# Patient Record
Sex: Female | Born: 1955 | Race: Black or African American | Hispanic: No | Smoking: Current some day smoker
Health system: Southern US, Community
[De-identification: ages and names within clinical notes are randomized; demographics above are authoritative.]

## PROBLEM LIST (undated history)

## (undated) DIAGNOSIS — M069 Rheumatoid arthritis, unspecified: Secondary | ICD-10-CM

## (undated) DIAGNOSIS — I10 Essential (primary) hypertension: Secondary | ICD-10-CM

## (undated) HISTORY — PX: TONSILLECTOMY: SUR1361

## (undated) HISTORY — PX: HAND SURGERY: SHX662

---

## 1998-01-16 ENCOUNTER — Other Ambulatory Visit: Admission: RE | Admit: 1998-01-16 | Discharge: 1998-01-16 | Payer: Self-pay | Admitting: Gynecology

## 1999-08-29 ENCOUNTER — Inpatient Hospital Stay (HOSPITAL_COMMUNITY): Admission: EM | Admit: 1999-08-29 | Discharge: 1999-09-01 | Payer: Self-pay | Admitting: *Deleted

## 1999-09-26 ENCOUNTER — Emergency Department (HOSPITAL_COMMUNITY): Admission: EM | Admit: 1999-09-26 | Discharge: 1999-09-26 | Payer: Self-pay | Admitting: Emergency Medicine

## 1999-11-04 ENCOUNTER — Encounter: Payer: Self-pay | Admitting: Internal Medicine

## 1999-11-04 ENCOUNTER — Emergency Department (HOSPITAL_COMMUNITY): Admission: EM | Admit: 1999-11-04 | Discharge: 1999-11-04 | Payer: Self-pay | Admitting: Internal Medicine

## 1999-11-05 ENCOUNTER — Encounter: Payer: Self-pay | Admitting: Cardiovascular Disease

## 1999-11-05 ENCOUNTER — Ambulatory Visit (HOSPITAL_COMMUNITY): Admission: RE | Admit: 1999-11-05 | Discharge: 1999-11-05 | Payer: Self-pay | Admitting: Cardiovascular Disease

## 2000-03-23 ENCOUNTER — Other Ambulatory Visit: Admission: RE | Admit: 2000-03-23 | Discharge: 2000-03-23 | Payer: Self-pay | Admitting: Gynecology

## 2000-04-01 ENCOUNTER — Encounter: Payer: Self-pay | Admitting: Gynecology

## 2000-04-01 ENCOUNTER — Encounter: Admission: RE | Admit: 2000-04-01 | Discharge: 2000-04-01 | Payer: Self-pay | Admitting: Gynecology

## 2000-07-02 ENCOUNTER — Emergency Department (HOSPITAL_COMMUNITY): Admission: EM | Admit: 2000-07-02 | Discharge: 2000-07-02 | Payer: Self-pay | Admitting: Emergency Medicine

## 2000-07-02 ENCOUNTER — Encounter: Payer: Self-pay | Admitting: Emergency Medicine

## 2001-01-25 ENCOUNTER — Encounter: Payer: Self-pay | Admitting: Emergency Medicine

## 2001-01-25 ENCOUNTER — Emergency Department (HOSPITAL_COMMUNITY): Admission: EM | Admit: 2001-01-25 | Discharge: 2001-01-25 | Payer: Self-pay | Admitting: Emergency Medicine

## 2001-09-23 ENCOUNTER — Other Ambulatory Visit: Admission: RE | Admit: 2001-09-23 | Discharge: 2001-09-23 | Payer: Self-pay | Admitting: Gynecology

## 2002-09-20 ENCOUNTER — Emergency Department (HOSPITAL_COMMUNITY): Admission: EM | Admit: 2002-09-20 | Discharge: 2002-09-20 | Payer: Self-pay | Admitting: Emergency Medicine

## 2002-11-20 ENCOUNTER — Encounter: Payer: Self-pay | Admitting: Emergency Medicine

## 2002-11-20 ENCOUNTER — Emergency Department (HOSPITAL_COMMUNITY): Admission: EM | Admit: 2002-11-20 | Discharge: 2002-11-20 | Payer: Self-pay | Admitting: Emergency Medicine

## 2002-11-23 ENCOUNTER — Encounter: Payer: Self-pay | Admitting: Internal Medicine

## 2002-11-23 ENCOUNTER — Encounter: Admission: RE | Admit: 2002-11-23 | Discharge: 2002-11-23 | Payer: Self-pay | Admitting: Internal Medicine

## 2002-12-21 ENCOUNTER — Emergency Department (HOSPITAL_COMMUNITY): Admission: EM | Admit: 2002-12-21 | Discharge: 2002-12-21 | Payer: Self-pay | Admitting: Emergency Medicine

## 2002-12-27 ENCOUNTER — Other Ambulatory Visit: Admission: RE | Admit: 2002-12-27 | Discharge: 2002-12-27 | Payer: Self-pay | Admitting: Gynecology

## 2004-02-21 ENCOUNTER — Encounter: Admission: RE | Admit: 2004-02-21 | Discharge: 2004-02-21 | Payer: Self-pay | Admitting: Internal Medicine

## 2005-04-03 ENCOUNTER — Emergency Department (HOSPITAL_COMMUNITY): Admission: EM | Admit: 2005-04-03 | Discharge: 2005-04-03 | Payer: Self-pay | Admitting: Family Medicine

## 2005-07-20 ENCOUNTER — Emergency Department (HOSPITAL_COMMUNITY): Admission: EM | Admit: 2005-07-20 | Discharge: 2005-07-20 | Payer: Self-pay | Admitting: Emergency Medicine

## 2006-01-13 ENCOUNTER — Encounter: Admission: RE | Admit: 2006-01-13 | Discharge: 2006-01-13 | Payer: Self-pay | Admitting: Internal Medicine

## 2007-03-09 ENCOUNTER — Encounter: Admission: RE | Admit: 2007-03-09 | Discharge: 2007-03-09 | Payer: Self-pay | Admitting: Internal Medicine

## 2008-03-19 ENCOUNTER — Encounter: Admission: RE | Admit: 2008-03-19 | Discharge: 2008-03-19 | Payer: Self-pay | Admitting: Internal Medicine

## 2008-06-27 ENCOUNTER — Emergency Department (HOSPITAL_COMMUNITY): Admission: EM | Admit: 2008-06-27 | Discharge: 2008-06-27 | Payer: Self-pay | Admitting: Emergency Medicine

## 2011-08-31 ENCOUNTER — Other Ambulatory Visit: Payer: Self-pay | Admitting: Internal Medicine

## 2011-08-31 DIAGNOSIS — Z1231 Encounter for screening mammogram for malignant neoplasm of breast: Secondary | ICD-10-CM

## 2011-09-02 ENCOUNTER — Ambulatory Visit
Admission: RE | Admit: 2011-09-02 | Discharge: 2011-09-02 | Disposition: A | Discharge status: Home / Self Care | Payer: BC Managed Care – PPO | Source: Ambulatory Visit | Attending: Internal Medicine | Admitting: Internal Medicine

## 2011-09-02 DIAGNOSIS — Z1231 Encounter for screening mammogram for malignant neoplasm of breast: Secondary | ICD-10-CM

## 2013-01-27 ENCOUNTER — Other Ambulatory Visit: Payer: Self-pay | Admitting: Internal Medicine

## 2013-01-27 DIAGNOSIS — IMO0002 Reserved for concepts with insufficient information to code with codable children: Secondary | ICD-10-CM

## 2013-02-04 ENCOUNTER — Ambulatory Visit
Admission: RE | Admit: 2013-02-04 | Discharge: 2013-02-04 | Disposition: A | Discharge status: Home / Self Care | Payer: BC Managed Care – PPO | Source: Ambulatory Visit | Attending: Internal Medicine | Admitting: Internal Medicine

## 2013-02-04 DIAGNOSIS — IMO0002 Reserved for concepts with insufficient information to code with codable children: Secondary | ICD-10-CM

## 2013-02-24 ENCOUNTER — Ambulatory Visit (HOSPITAL_BASED_OUTPATIENT_CLINIC_OR_DEPARTMENT_OTHER): Payer: BC Managed Care – PPO

## 2014-01-21 ENCOUNTER — Emergency Department (HOSPITAL_COMMUNITY)
Admission: EM | Admit: 2014-01-21 | Discharge: 2014-01-21 | Disposition: A | Discharge status: Home / Self Care | Payer: BC Managed Care – PPO | Attending: Emergency Medicine | Admitting: Emergency Medicine

## 2014-01-21 ENCOUNTER — Encounter (HOSPITAL_COMMUNITY): Payer: Self-pay | Admitting: Emergency Medicine

## 2014-01-21 DIAGNOSIS — I10 Essential (primary) hypertension: Secondary | ICD-10-CM | POA: Diagnosis not present

## 2014-01-21 DIAGNOSIS — Z791 Long term (current) use of non-steroidal anti-inflammatories (NSAID): Secondary | ICD-10-CM | POA: Insufficient documentation

## 2014-01-21 DIAGNOSIS — H209 Unspecified iridocyclitis: Secondary | ICD-10-CM | POA: Insufficient documentation

## 2014-01-21 DIAGNOSIS — J3489 Other specified disorders of nose and nasal sinuses: Secondary | ICD-10-CM | POA: Diagnosis not present

## 2014-01-21 DIAGNOSIS — Z79899 Other long term (current) drug therapy: Secondary | ICD-10-CM | POA: Insufficient documentation

## 2014-01-21 DIAGNOSIS — H5789 Other specified disorders of eye and adnexa: Secondary | ICD-10-CM | POA: Insufficient documentation

## 2014-01-21 DIAGNOSIS — M069 Rheumatoid arthritis, unspecified: Secondary | ICD-10-CM | POA: Diagnosis not present

## 2014-01-21 DIAGNOSIS — F17200 Nicotine dependence, unspecified, uncomplicated: Secondary | ICD-10-CM | POA: Diagnosis not present

## 2014-01-21 HISTORY — DX: Rheumatoid arthritis, unspecified: M06.9

## 2014-01-21 HISTORY — DX: Essential (primary) hypertension: I10

## 2014-01-21 MED ORDER — KETOROLAC TROMETHAMINE 0.5 % OP SOLN
1.0000 [drp] | Freq: Four times a day (QID) | OPHTHALMIC | Status: DC
  Administered 2014-01-21: 1 [drp] via OPHTHALMIC
  Filled 2014-01-21 (×2): qty 3

## 2014-01-21 MED ORDER — CYCLOPENTOLATE HCL 1 % OP SOLN
1.0000 [drp] | Freq: Once | OPHTHALMIC | Status: DC
  Filled 2014-01-21: qty 2

## 2014-01-21 MED ORDER — PREDNISOLONE ACETATE 1 % OP SUSP
1.0000 [drp] | Freq: Four times a day (QID) | OPHTHALMIC | Status: DC
  Administered 2014-01-21: 1 [drp] via OPHTHALMIC
  Filled 2014-01-21 (×2): qty 1

## 2014-01-21 MED ORDER — FLUORESCEIN SODIUM 1 MG OP STRP
1.0000 | ORAL_STRIP | Freq: Once | OPHTHALMIC | Status: AC
Start: 2014-01-21 — End: 2014-01-21
  Administered 2014-01-21: 1 via OPHTHALMIC
  Filled 2014-01-21: qty 1

## 2014-01-21 MED ORDER — TETRACAINE HCL 0.5 % OP SOLN
2.0000 [drp] | Freq: Once | OPHTHALMIC | Status: AC
  Administered 2014-01-21: 2 [drp] via OPHTHALMIC
  Filled 2014-01-21: qty 2

## 2014-01-21 NOTE — Discharge Instructions (Signed)
1. Medications: pred-forte, ketoralac, usual home medications 2. Treatment: rest, drink plenty of fluids, use medications before bed tonight and again in the morning before seeing Dr. Delaney Meigs 3. Follow Up: Please see Dr. Delaney Meigs at his office at the address listed above at 9am.     Iritis Iritis is an inflammation of the colored part of the eye (iris). Other parts at the front of the eye may also be inflamed. The iris is part of the middle layer of the eyeball which is called the uvea or the uveal track. Any part of the uveal track can become inflamed. The other portions of the uveal track are the choroid (the thin membrane under the outer layer of the eye), and the ciliary body (joins the choroid and the iris and produces the fluid in the front of the eye).  It is extremely important to treat iritis early, as it may lead to internal eye damage causing scarring or diseases such as glaucoma. Some people have only one attack of iritis (in one or both eyes) in their lifetime, while others may get it many times. CAUSES Iritis can be associated with many different diseases, but mostly occurs in otherwise healthy people. Examples of diseases that can be associated with iritis include:  Diseases where the body's immune system attacks tissues within your own body (autoimmune diseases).  Infections (tuberculosis, gonorrhea, fungus infections, Lyme disease, infection of the lining of the heart).  Trauma or injury.  Eye diseases (acute glaucoma and others).  Inflammation from other parts of the uveal track.  Severe eye infections.  Other rare diseases. SYMPTOMS  Eye pain or aching.  Sensitivity to light.  Loss of sight or blurred vision.  Redness of the eye. This is often accompanied by a ring of redness around the outside of the cornea, or clear covering at the front of the eye (ciliary flush).  Excessive tearing of the eye(s).  A small pupil that does not enlarge in the dark and  stays smaller than the other eye's pupil.  A whitish area that obscures the lower part of the colored circular iris. Sometimes this is visible when looking at the eye, where the whitish area has a "fluid level" or flat top. This is called a "hypopyon" and is actually pus inside the eye. Since iritis causes the eye to become red, it is often confused with a much less dangerous form of "pink eye" or conjunctivitis. One of the most important symptoms is sensitivity to light. Anytime there is redness, discomfort in the eye(s) and extreme light sensitivity, it is extremely important to see an ophthalmologist as soon as possible. TREATMENT Acute iritis requires prompt medical evaluation by an eye specialist (ophthalmologist.) Treatment depends on the underlying cause but may include:  Corticosteroid eye drops and dilating eye drops. Follow your caregiver's exact instructions on taking and stopping corticosteroid medications (drops or pills).  Occasionally, the iritis will be so severe that it will not respond to commonly used medications. If this happens, it may be necessary to use steroid injections. The injections are given under the eye's outer surface. Sometimes oral medications are given. The decision on treatment used for iritis is usually made on an individual basis. HOME CARE INSTRUCTIONS Your care giver will give specific instructions regarding the use of eye medications or other medications. Be certain to follow all instructions in both taking and stopping the medications. SEEK IMMEDIATE MEDICAL CARE IF:  You have redness of one or both eye.  You experience a  great deal of light sensitivity.  You have pain or aching in either eye. MAKE SURE YOU:   Understand these instructions.  Will watch your condition.  Will get help right away if you are not doing well or get worse. Document Released: 05/18/2005 Document Revised: 08/10/2011 Document Reviewed: 11/05/2006 [facility] Patient  Information 2015 [place], [place]. This information is not intended to replace advice given to you by your health care provider. Make sure you discuss any questions you have with your health care provider.

## 2014-01-21 NOTE — ED Notes (Signed)
Pt presents with left eye pain and redness since Wednesday, admits to drainage from eye.  Reports some blurred vision.

## 2014-01-21 NOTE — ED Provider Notes (Signed)
CSN: [id number]     Arrival date & time 01/21/14  2045 History  This chart was scribed for non-physician Dierdre Forth, PA-C, practitioner working with Elwin Mocha, MD, by Yevette Edwards, ED Scribe. This patient was seen in room TR04C/TR04C and the patient's care was started at 9:31 PM.   First MD Initiated Contact with Patient 01/21/14 2110     Chief Complaint  Patient presents with  . Eye Drainage    HPI HPI Comments: Sarah Sloan is a 58 y.o. female, with a h/o HTN, who presents to the Emergency Department complaining of gradually-increasing left eye pain which worsened this evening. Sarah Sloan reports five days ago she initially experienced itching to the eye which persisted for several days,. Then,  yesterday, she developed redness to the eye, and she experienced increasing ocular pain. She also endorses tearing from the eye, blurred vision, and photophobia. Sarah Sloan reports she experiences pain to the left eye when the right eye is exposed to the light as well. She denies a sensation of a foreign body. She wears corrective lens, but she states her prescription has not been updated recently. She denies contact usage. She reports chronic sinus infections which she treats with nasal spray; she denies current Flonase use. She takes HTN medication as directed; in the ED her BP is 162/83. Sarah Sloan is a current smoker.   Past Medical History  Diagnosis Date  . Hypertension   . Rheumatoid arthritis    History reviewed. No pertinent past surgical history. No family history on file. History  Substance Use Topics  . Smoking status: Current Some Day Smoker  . Smokeless tobacco: Not on file  . Alcohol Use: Yes   No OB history provided.  Review of Systems  Constitutional: Negative for fever, diaphoresis, appetite change, fatigue and unexpected weight change.  HENT: Negative for mouth sores.   Eyes: Positive for photophobia, pain, redness, itching and visual disturbance.   Respiratory: Negative for cough, chest tightness, shortness of breath and wheezing.   Cardiovascular: Negative for chest pain.  Gastrointestinal: Negative for nausea, vomiting, abdominal pain, diarrhea and constipation.  Endocrine: Negative for polydipsia, polyphagia and polyuria.  Genitourinary: Negative for dysuria, urgency, frequency and hematuria.  Musculoskeletal: Negative for back pain and neck stiffness.  Skin: Negative for rash.  Allergic/Immunologic: Negative for immunocompromised state.  Neurological: Negative for syncope, light-headedness and headaches.  Hematological: Does not bruise/bleed easily.  Psychiatric/Behavioral: Negative for sleep disturbance. The patient is not nervous/anxious.     Allergies  Penicillins  Home Medications   Prior to Admission medications   Medication Sig Start Date End Date Taking? Authorizing Provider  budesonide-formoterol (SYMBICORT) 160-4.5 MCG/ACT inhaler Inhale 2 puffs into the lungs 2 (two) times daily.   Yes Historical Provider, MD  Cholecalciferol (VITAMIN D PO) Take 1 tablet by mouth daily.   Yes Historical Provider, MD  diclofenac (VOLTAREN) 75 MG EC tablet Take 75 mg by mouth 2 (two) times daily.  11/28/13  Yes Historical Provider, MD  GNP GARLIC EXTRACT PO Take 5 drops by mouth daily as needed (chest tightness).   Yes Historical Provider, MD  losartan-hydrochlorothiazide (HYZAAR) 50-12.5 MG per tablet Take 1 tablet by mouth daily.  01/02/14  Yes Historical Provider, MD  Menthol, Topical Analgesic, (BIOFREEZE EX) Apply 1 application topically 2 (two) times daily.   Yes Historical Provider, MD  OVER THE COUNTER MEDICATION Take 240 mLs by mouth daily as needed (chest tightness). White oak bark - brew and drink 8 oz  Yes Historical Provider, MD  PRESCRIPTION MEDICATION Inhale 2 puffs into the lungs daily as needed (shortness of breath/wheezing). Rescue inhaler   Yes Historical Provider, MD  sodium chloride (OCEAN) 0.65 % SOLN nasal spray  Place 2 sprays into both nostrils daily.   Yes Historical Provider, MD   Triage Vitals: BP 162/83  Pulse 71  Temp(Src) 98.6 F (37 C) (Oral)  Resp 18  Ht 5\' 8"  (1.727 m)  Wt 240 lb (108.863 kg)  BMI 36.50 kg/m2  SpO2 97%  Physical Exam  Constitutional: She is oriented to person, place, and time. She appears well-developed and well-nourished. No distress.  HENT:  Head: Normocephalic and atraumatic.  Right Ear: Tympanic membrane, external ear and ear canal normal.  Left Ear: Tympanic membrane, external ear and ear canal normal.  Nose: Mucosal edema and rhinorrhea present. No epistaxis. Right sinus exhibits no maxillary sinus tenderness and no frontal sinus tenderness. Left sinus exhibits no maxillary sinus tenderness and no frontal sinus tenderness.  Mouth/Throat: Uvula is midline and mucous membranes are normal. Mucous membranes are not pale and not cyanotic. No oropharyngeal exudate, posterior oropharyngeal edema, posterior oropharyngeal erythema or tonsillar abscesses.  Eyes: EOM and lids are normal. Pupils are equal, round, and reactive to light. Lids are everted and swept, no foreign bodies found. Right eye exhibits no chemosis, no discharge and no exudate. No foreign body present in the right eye. Left eye exhibits no chemosis, no discharge and no exudate. No foreign body present in the left eye. Right conjunctiva is not injected. Right conjunctiva has no hemorrhage. Left conjunctiva is injected. Left conjunctiva has no hemorrhage. Right eye exhibits normal extraocular motion. Left eye exhibits normal extraocular motion.  Fundoscopic exam:      The right eye shows no arteriolar narrowing, no AV nicking, no exudate and no hemorrhage.       The left eye shows no arteriolar narrowing, no AV nicking, no exudate and no hemorrhage.  Slit lamp exam:      The right eye shows no corneal abrasion, no corneal flare, no corneal ulcer, no foreign body, no hyphema, no fluorescein uptake and no  anterior chamber bulge.       The left eye shows no corneal abrasion, no corneal flare, no corneal ulcer, no foreign body, no hyphema, no fluorescein uptake and no anterior chamber bulge.  No corneal abrasions visualized with fluorescein.  Left eye: injection of the conjunctiva with full extraocular motions. Direct and consensual photophobia with myosis.  Tonopen: 11 Left eye No absrasions, ulcers, foreign bodies, or dendritic lesions.  Visual Acuity - Bilateral Distance: 20/40 ; R Distance: 20/40 ; L Distance: 20/50  Neck: Normal range of motion and full passive range of motion without pain.  Cardiovascular: Normal rate and intact distal pulses.   Pulmonary/Chest: Effort normal and breath sounds normal. No stridor.  Clear and equal breath sounds without focal wheezes, rhonchi, rales  Abdominal: Soft. Bowel sounds are normal. There is no tenderness.  Musculoskeletal: Normal range of motion.  Lymphadenopathy:    She has no cervical adenopathy.  Neurological: She is alert and oriented to person, place, and time.  Skin: Skin is warm and dry. No rash noted. She is not diaphoretic.  Psychiatric: She has a normal mood and affect.    ED Course  Procedures (including critical care time)  DIAGNOSTIC STUDIES: Oxygen Saturation is 97% on room air, normal by my interpretation.    COORDINATION OF CARE:  9:37 PM- Discussed treatment plan with patient,  and the patient agreed to the plan. Provided pt with opthamologist referral. Will also provided medication.   9:40 PM- Measured pt's tonometry. Left eye pressure with tonopen: 11. Performed fluorescein procedure.   9:48 PM- Consulted with Dr. Gwendolyn Grant re the pt's course of care.   Labs Review Labs Reviewed - No data to display  Imaging Review No results found.   EKG Interpretation None      MDM   Final diagnoses:  Iritis    Sarah Sloan presents with history and physical consistent with iritis. Patient with history of hypertension  however no evidence of elevated intraocular pressure to suggest acute angle closure glaucoma.  No abrasions to suggest corneal abrasion. No dendritic lesions to suggest herpes.    10:06 PM Discussed with Dr. Delaney Meigs who recommends Cyclodryl, pred-forte and ketoralac.  He will see her in his office tomorrow morning at 9 AM.  Discussed all these things with the patient. She reports she will followup. She knows to return to emergency room for worsening symptoms.  BP 162/83  Pulse 71  Temp(Src) 98.6 F (37 C) (Oral)  Resp 18  Ht 5\' 8"  (1.727 m)  Wt 240 lb (108.863 kg)  BMI 36.50 kg/m2  SpO2 97%   I personally performed the services described in this documentation, which was scribed in my presence. The recorded information has been reviewed and is accurate.   Anothy Bufano, PA-C 01/22/14 [phone]

## 2014-01-25 NOTE — ED Provider Notes (Signed)
Medical screening examination/treatment/procedure(s) were performed by non-physician practitioner and as supervising physician I was immediately available for consultation/collaboration.   EKG Interpretation None        Elwin Mocha, MD 01/25/14 [phone]

## 2016-06-17 ENCOUNTER — Emergency Department (HOSPITAL_COMMUNITY): Payer: Worker's Compensation

## 2016-06-17 ENCOUNTER — Encounter (HOSPITAL_COMMUNITY): Payer: Self-pay

## 2016-06-17 ENCOUNTER — Emergency Department (HOSPITAL_COMMUNITY)
Admission: EM | Admit: 2016-06-17 | Discharge: 2016-06-17 | Disposition: A | Discharge status: Home / Self Care | Payer: Worker's Compensation | Attending: Emergency Medicine | Admitting: Emergency Medicine

## 2016-06-17 DIAGNOSIS — Y929 Unspecified place or not applicable: Secondary | ICD-10-CM | POA: Diagnosis not present

## 2016-06-17 DIAGNOSIS — W000XXA Fall on same level due to ice and snow, initial encounter: Secondary | ICD-10-CM | POA: Diagnosis not present

## 2016-06-17 DIAGNOSIS — Y9329 Activity, other involving ice and snow: Secondary | ICD-10-CM | POA: Diagnosis not present

## 2016-06-17 DIAGNOSIS — W19XXXA Unspecified fall, initial encounter: Secondary | ICD-10-CM

## 2016-06-17 DIAGNOSIS — F17200 Nicotine dependence, unspecified, uncomplicated: Secondary | ICD-10-CM | POA: Insufficient documentation

## 2016-06-17 DIAGNOSIS — S52502A Unspecified fracture of the lower end of left radius, initial encounter for closed fracture: Secondary | ICD-10-CM | POA: Diagnosis not present

## 2016-06-17 DIAGNOSIS — S6992XA Unspecified injury of left wrist, hand and finger(s), initial encounter: Secondary | ICD-10-CM | POA: Diagnosis present

## 2016-06-17 DIAGNOSIS — Y999 Unspecified external cause status: Secondary | ICD-10-CM | POA: Diagnosis not present

## 2016-06-17 DIAGNOSIS — I10 Essential (primary) hypertension: Secondary | ICD-10-CM | POA: Insufficient documentation

## 2016-06-17 MED ORDER — LIDOCAINE HCL (PF) 1 % IJ SOLN
30.0000 mL | Freq: Once | INTRAMUSCULAR | Status: AC
  Administered 2016-06-17: 30 mL
  Filled 2016-06-17: qty 30

## 2016-06-17 MED ORDER — ONDANSETRON 4 MG PO TBDP
4.0000 mg | ORAL_TABLET | Freq: Once | ORAL | Status: AC
  Administered 2016-06-17: 4 mg via ORAL
  Filled 2016-06-17: qty 1

## 2016-06-17 MED ORDER — HYDROCODONE-ACETAMINOPHEN 5-325 MG PO TABS: 1.0000 | ORAL_TABLET | ORAL | 0 refills | Status: DC | PRN

## 2016-06-17 MED ORDER — ETOMIDATE 2 MG/ML IV SOLN
0.1500 mg/kg | Freq: Once | INTRAVENOUS | Status: DC
  Filled 2016-06-17: qty 10

## 2016-06-17 MED ORDER — MORPHINE SULFATE (PF) 4 MG/ML IV SOLN
4.0000 mg | Freq: Once | INTRAVENOUS | Status: AC
  Administered 2016-06-17: 4 mg via INTRAMUSCULAR
  Filled 2016-06-17: qty 1

## 2016-06-17 MED ORDER — IBUPROFEN 600 MG PO TABS: 600.0000 mg | ORAL_TABLET | Freq: Four times a day (QID) | ORAL | 0 refills | Status: AC | PRN

## 2016-06-17 NOTE — ED Provider Notes (Signed)
[facility]-EMERGENCY DEPT Provider Note   CSN: [id number] Arrival date & time: 06/17/16  1256  History   Chief Complaint Chief Complaint  Patient presents with  . Fall    HPI Sarah Sloan is a 61 y.o. female.  HPI  Pt has hx of RA and hypertension. Comes to the ER by EMS after a fall. She slipped on ice, landing on her left outstretched hand to break her fall. She is ambulatory. Denies hitting her head or injuring her neck, no loc. She has pain to her left elbow and left shoulder. She is in an arm sling and wrist splint on arrival from EMS. Pain 8/10, no pain medication given yet.  Past Medical History:  Diagnosis Date  . Hypertension   . Rheumatoid arthritis (HCC)     There are no active problems to display for this patient.   History reviewed. No pertinent surgical history.  OB History    No data available       Home Medications    Prior to Admission medications   Medication Sig Start Date End Date Taking? Authorizing Provider  Cholecalciferol (VITAMIN D PO) Take 1 tablet by mouth daily.   Yes Historical Provider, MD  GARLIC-PARSLEY PO Take 1 tablet by mouth daily.   Yes Historical Provider, MD  Ginger, Zingiber officinalis, (GINGER PO) Take 1 tablet by mouth daily.   Yes Historical Provider, MD  Luster Landsberg Bioflavonoid (LEMON BIOFLAVANOID) POWD Take 1 tablet by mouth daily.   Yes Historical Provider, MD  Menthol, Topical Analgesic, (BIOFREEZE EX) Apply 1 application topically 2 (two) times daily as needed (pain).    Yes Historical Provider, MD  Multiple Vitamins-Minerals (MULTIVITAMIN WITH MINERALS) tablet Take 1 tablet by mouth daily.   Yes Historical Provider, MD  budesonide-formoterol (SYMBICORT) 160-4.5 MCG/ACT inhaler Inhale 2 puffs into the lungs 2 (two) times daily.    Historical Provider, MD  diclofenac (VOLTAREN) 75 MG EC tablet Take 75 mg by mouth 2 (two) times daily.  11/28/13   Historical Provider, MD  HYDROcodone-acetaminophen (NORCO/VICODIN) 5-325 MG tablet Take  1-2 tablets by mouth every 4 (four) hours as needed. 06/17/16   Wendall Isabell Neva Seat, PA-C  ibuprofen (ADVIL,MOTRIN) 600 MG tablet Take 1 tablet (600 mg total) by mouth every 6 (six) hours as needed. 06/17/16   Scarleth Brame Neva Seat, PA-C  losartan-hydrochlorothiazide (HYZAAR) 50-12.5 MG per tablet Take 1 tablet by mouth daily.  01/02/14   Historical Provider, MD    Family History History reviewed. No pertinent family history.  Social History Social History  Substance Use Topics  . Smoking status: Current Some Day Smoker  . Smokeless tobacco: Never Used  . Alcohol use Yes     Allergies   Penicillins   Review of Systems Review of Systems Review of Systems All other systems negative except as documented in the HPI. All pertinent positives and negatives as reviewed in the HPI.   Physical Exam Updated Vital Signs BP 167/87 (BP Location: Left Arm)   Pulse 61   Temp 98 F (36.7 C) (Oral)   Resp 18   Ht 5\' 8"  (1.727 m)   Wt 104.3 kg   SpO2 97%   BMI 34.97 kg/m   Physical Exam  Constitutional: She appears well-developed and well-nourished. No distress.  HENT:  Head: Normocephalic and atraumatic.  Eyes: Pupils are equal, round, and reactive to light.  Neck: Normal range of motion. Neck supple.  Cardiovascular: Normal rate and regular rhythm.   Pulmonary/Chest: Effort normal.  Abdominal: Soft.  Musculoskeletal:  Left shoulder: She exhibits tenderness.       Left elbow: Tenderness found.       Left wrist: She exhibits decreased range of motion, tenderness, bony tenderness, swelling and deformity. She exhibits no effusion, no crepitus and no laceration.  Pulses strong, sensation to all 5 fingers intact, flexion/extension to all 5 fingers intact.  Neurological: She is alert.  Skin: Skin is warm and dry.  Nursing note and vitals reviewed.   ED Treatments / Results  Labs (all labs ordered are listed, but only abnormal results are displayed) Labs Reviewed - No data to  display  EKG  EKG Interpretation None       Radiology Dg Elbow Complete Left  Result Date: 06/17/2016 CLINICAL DATA:  Fall in snow today. Left elbow injury and pain. Initial encounter. EXAM: LEFT ELBOW - COMPLETE 3+ VIEW COMPARISON:  None FINDINGS: There is no evidence of fracture, dislocation, or joint effusion. There is no evidence of arthropathy or other focal bone abnormality. Soft tissues are unremarkable. IMPRESSION: Negative. Electronically Signed   By: Myles Rosenthal M.D.   On: 06/17/2016 13:50   Dg Wrist Complete Left  Result Date: 06/17/2016 CLINICAL DATA:  Post reduction of distal radial fracture. EXAM: LEFT WRIST - COMPLETE 3+ VIEW COMPARISON:  Left wrist radiographs-earlier same day FINDINGS: Evaluation of fine bone detail is degraded secondary to overlying splint apparatus. Improved alignment of previously noted comminuted displaced radial fracture with persistent impaction and angulation, apex volar. The fracture is again noted to extend to the distal radioulnar joint. No additional fractures identified. No dislocation. Joint spaces are preserved. Spaces adjacent soft tissue swelling. No radiopaque foreign body. IMPRESSION: Improved alignment of persistently displaced comminuted distal radial fracture. Electronically Signed   By: Simonne Come M.D.   On: 06/17/2016 15:26   Dg Wrist Complete Left  Result Date: 06/17/2016 CLINICAL DATA:  Fall in snow. Left wrist pain and deformity. Initial encounter. EXAM: LEFT WRIST - COMPLETE 3+ VIEW COMPARISON:  None. FINDINGS: Comminuted fracture of distal radial metaphysis is seen with extension into the distal radioulnar joint. There is mild dorsal angulation of the distal articular surface the radius. Avulsion fracture from the ulnar styloid process is also seen. Carpal bones are normal in appearance alignment. IMPRESSION: Comminuted fracture of distal radial metaphysis, with mild dorsal angulation. Avulsion fracture from the ulnar styloid process.  Electronically Signed   By: Myles Rosenthal M.D.   On: 06/17/2016 13:49   Dg Shoulder Left  Result Date: 06/17/2016 CLINICAL DATA:  Fall in snow today. Left shoulder injury and pain. Initial encounter. EXAM: LEFT SHOULDER - 2+ VIEW COMPARISON:  None. FINDINGS: There is no evidence of fracture or dislocation. There is no evidence of arthropathy or other focal bone abnormality. Soft tissues are unremarkable. IMPRESSION: Negative. Electronically Signed   By: Myles Rosenthal M.D.   On: 06/17/2016 13:51    Procedures Procedures (including critical care time)  Medications Ordered in ED Medications  etomidate (AMIDATE) injection 15.64 mg (not administered)  morphine 4 MG/ML injection 4 mg (4 mg Intramuscular Given 06/17/16 1449)  ondansetron (ZOFRAN-ODT) disintegrating tablet 4 mg (4 mg Oral Given 06/17/16 1449)  lidocaine (PF) (XYLOCAINE) 1 % injection 30 mL (30 mLs Other Given by Other 06/17/16 1441)     Initial Impression / Assessment and Plan / ED Course  I have reviewed the triage vital signs and the nursing notes.  Pertinent labs & imaging results that were available during my care of the patient were reviewed by  me and considered in my medical decision making (see chart for details).  Clinical Course    Reduction of wrist in ED, with mild interval improvement performed by Dr. Patria Mane. Long arm splint and shoulder sling applied. Patient tolerated procedure well and didn't require conscious sedation. Dr. Patria Mane attempted to get in touch with Dr. Mina Marble but was unsuccessful in touching basis with the hand specialist. Will give patient education and have her call office tomorrow for follow-up.  Final Clinical Impressions(s) / ED Diagnoses   Final diagnoses:  Closed fracture of distal end of left radius, unspecified fracture morphology, initial encounter  Fall, initial encounter    New Prescriptions New Prescriptions   HYDROCODONE-ACETAMINOPHEN (NORCO/VICODIN) 5-325 MG TABLET    Take 1-2  tablets by mouth every 4 (four) hours as needed.   IBUPROFEN (ADVIL,MOTRIN) 600 MG TABLET    Take 1 tablet (600 mg total) by mouth every 6 (six) hours as needed.     Marlon Pel, PA-C 06/17/16 1553    Azalia Bilis, MD 06/17/16 [phone]

## 2016-06-17 NOTE — ED Notes (Signed)
PT DISCHARGED. INSTRUCTIONS AND PRESCRIPTIONS GIVEN. AAOX4. PT IN NO APPARENT DISTRESS. THE OPPORTUNITY TO ASK QUESTIONS WAS PROVIDED. 

## 2016-06-17 NOTE — ED Triage Notes (Signed)
Per EMS, patient c/o left wrist pain after fall walking in the snow. Deformity noted. Patient also reports right lower back pain. Denies head injury and LOC.   BP 164/96 HR 80 RR 16

## 2017-05-14 ENCOUNTER — Ambulatory Visit (INDEPENDENT_AMBULATORY_CARE_PROVIDER_SITE_OTHER): Payer: BC Managed Care – PPO

## 2017-05-14 ENCOUNTER — Ambulatory Visit (HOSPITAL_COMMUNITY)
Admission: EM | Admit: 2017-05-14 | Discharge: 2017-05-14 | Disposition: A | Discharge status: Home / Self Care | Payer: BC Managed Care – PPO | Attending: Emergency Medicine | Admitting: Emergency Medicine

## 2017-05-14 ENCOUNTER — Encounter (HOSPITAL_COMMUNITY): Payer: Self-pay | Admitting: Emergency Medicine

## 2017-05-14 DIAGNOSIS — J019 Acute sinusitis, unspecified: Secondary | ICD-10-CM | POA: Diagnosis not present

## 2017-05-14 MED ORDER — AZITHROMYCIN 250 MG PO TABS: ORAL_TABLET | ORAL | 0 refills | Status: DC

## 2017-05-14 NOTE — ED Provider Notes (Signed)
[facility]    CSN: [id number] Arrival date & time: 05/14/17  1002     History   Chief Complaint Chief Complaint  Patient presents with  . URI    HPI Sarah Sloan is a 61 y.o. female history of smoking presenting with sinus congestion for 1 month. States she has felt pressure and congestion in her face for the past month. She has had an occasional sore throat. Endorses ear pain, rhinnorhea, no cough. Endorses rib pain specifically under her bra on both sides and increased shortness of breath. She has felt fatigued. She states her shortness of breath worsens when working around chemicals. Mild increase with breathing. She smokes 1 pack every 2 days, has cut down from 2 packs a day. She has taken children's benadryl and tried green tea, lemon and honey.  She is also concerned about right arm pain and weakness.   HPI  Past Medical History:  Diagnosis Date  . Hypertension   . Rheumatoid arthritis (HCC)     There are no active problems to display for this patient.   History reviewed. No pertinent surgical history.  OB History    No data available       Home Medications    Prior to Admission medications   Medication Sig Start Date End Date Taking? Authorizing Provider  Cholecalciferol (VITAMIN D PO) Take 1 tablet by mouth daily.   Yes [provider]  GARLIC-PARSLEY PO Take 1 tablet by mouth daily.   Yes [provider]  Ginger, Zingiber officinalis, (GINGER PO) Take 1 tablet by mouth daily.   Yes [provider]  azithromycin (ZITHROMAX Z-PAK) 250 MG tablet Please take 2 tablets on day one, followed by 1 tablet the following 4 days 05/14/17   Wieters, Hallie C, PA-C  budesonide-formoterol (SYMBICORT) 160-4.5 MCG/ACT inhaler Inhale 2 puffs into the lungs 2 (two) times daily.    [provider]  diclofenac (VOLTAREN) 75 MG EC tablet Take 75 mg by mouth 2 (two) times daily.  11/28/13   [provider]    HYDROcodone-acetaminophen (NORCO/VICODIN) 5-325 MG tablet Take 1-2 tablets by mouth every 4 (four) hours as needed. 06/17/16   Marlon Pel, PA-C  ibuprofen (ADVIL,MOTRIN) 600 MG tablet Take 1 tablet (600 mg total) by mouth every 6 (six) hours as needed. 06/17/16   Neva Seat, Tiffany, PA-C  Lemon Bioflavonoid (LEMON BIOFLAVANOID) POWD Take 1 tablet by mouth daily.    [provider]  losartan-hydrochlorothiazide (HYZAAR) 50-12.5 MG per tablet Take 1 tablet by mouth daily.  01/02/14   [provider]  Menthol, Topical Analgesic, (BIOFREEZE EX) Apply 1 application topically 2 (two) times daily as needed (pain).     [provider]  Multiple Vitamins-Minerals (MULTIVITAMIN WITH MINERALS) tablet Take 1 tablet by mouth daily.    [provider]    Family History History reviewed. No pertinent family history.  Social History Social History   Tobacco Use  . Smoking status: Current Some Day Smoker  . Smokeless tobacco: Never Used  Substance Use Topics  . Alcohol use: Yes  . Drug use: Not on file     Allergies   Penicillins   Review of Systems Review of Systems  Constitutional: Positive for diaphoresis and fatigue. Negative for chills and fever.  HENT: Positive for congestion, ear pain, rhinorrhea, sinus pressure and sore throat. Negative for trouble swallowing and voice change.   Eyes: Negative for pain.  Respiratory: Positive for chest tightness and shortness of breath.  Negative for cough.   Cardiovascular: Negative for chest pain.  Gastrointestinal: Negative for abdominal pain, diarrhea, nausea and vomiting.  Musculoskeletal: Positive for myalgias. Negative for back pain.  Skin: Negative for rash.  Neurological: Positive for dizziness, weakness and headaches. Negative for syncope, light-headedness and numbness.     Physical Exam Triage Vital Signs ED Triage Vitals  Enc Vitals Group     BP 05/14/17 1030 (!) 174/77     Pulse Rate 05/14/17 1030  64     Resp 05/14/17 1030 20     Temp 05/14/17 1030 98.2 F (36.8 C)     Temp Source 05/14/17 1030 Oral     SpO2 05/14/17 1030 100 %     Weight --      Height --      Head Circumference --      Peak Flow --      Pain Score 05/14/17 1032 5     Pain Loc --      Pain Edu? --      Excl. in GC? --    No data found.  Updated Vital Signs BP (!) 174/77 (BP Location: Right Arm)   Pulse 64   Temp 98.2 F (36.8 C) (Oral)   Resp 20   SpO2 100%    Physical Exam  Constitutional: She is oriented to person, place, and time. She appears well-developed and well-nourished. No distress.  HENT:  Head: Normocephalic and atraumatic.  Right Ear: Tympanic membrane and ear canal normal. Tympanic membrane is not erythematous. No middle ear effusion.  Left Ear: Tympanic membrane and ear canal normal. Tympanic membrane is not erythematous.  No middle ear effusion.  Nose: Right sinus exhibits maxillary sinus tenderness. Right sinus exhibits no frontal sinus tenderness. Left sinus exhibits maxillary sinus tenderness. Left sinus exhibits no frontal sinus tenderness.  Mouth/Throat: Uvula is midline and mucous membranes are normal. No trismus in the jaw. No uvula swelling. Posterior oropharyngeal erythema present. Tonsils are 1+ on the right. Tonsils are 1+ on the left. No tonsillar exudate.  Eyes: Conjunctivae are normal.  Neck: Normal range of motion. Neck supple.  Lymphadenopathy to right anterior cervical chain, mobile, non tender  Cardiovascular: Normal rate and regular rhythm.  No murmur heard. Pulmonary/Chest: Effort normal and breath sounds normal. No respiratory distress.  Prolonged expiratory phase throughout left lung fields  Abdominal: Soft. There is no tenderness.  Musculoskeletal: She exhibits no edema.  Neurological: She is alert and oriented to person, place, and time.  Skin: Skin is warm and dry.  Psychiatric: She has a normal mood and affect.  Nursing note and vitals reviewed.    UC  Treatments / Results  Labs (all labs ordered are listed, but only abnormal results are displayed) Labs Reviewed - No data to display  EKG  EKG Interpretation None       Radiology Dg Chest 2 View  Result Date: 05/14/2017 CLINICAL DATA:  Bilateral rib pain for 1 month. Smoker. Some shortness of breath. EXAM: CHEST  2 VIEW COMPARISON:  None. FINDINGS: Both lungs are clear. Heart and mediastinum are within normal limits. Trachea is midline. No pleural effusions. Bony thorax appears to be intact. IMPRESSION: No active cardiopulmonary disease. Electronically Signed   By: Richarda Overlie M.D.   On: 05/14/2017 11:27    Procedures Procedures (including critical care time)  Medications Ordered in UC Medications - No data to display   Initial Impression / Assessment and Plan / UC Course  I have reviewed the  triage vital signs and the nursing notes.  Pertinent labs & imaging results that were available during my care of the patient were reviewed by me and considered in my medical decision making (see chart for details).     CXR without evidence of pneumonia. Given length of sinus symptoms, we will treat for bacterial sinusitis with azithromycin as patient states she has an allergy to penicillins. Differential includes bacterial pneumonia, sinusitis, allergic rhinitis, viral uri, acute bronchitis. Do not suspect underlying cardiopulmonary process. Symptoms seem unlikely related to ACS, CHF or COPD exacerbations, pneumonia, pneumothorax. Patient is nontoxic appearing and not in need of emergent medical intervention.  Recommended symptom control with over the counter medications: Daily oral anti-histamine, Oral decongestant or IN corticosteroid, saline irrigations, cepacol lozenges, Robitussin, Delsym, honey tea.  Return if symptoms fail to improve in 1-2 weeks or you develop shortness of breath, chest pain, severe headache. Patient states understanding and is agreeable.  Explained her weakness  could be related to weight, smoking and/or feeling sick for almost a month. Follow up if symptoms not improving with treatment.  Discharged with PCP followup- check blood pressure, evaluate persistent fatigue/weakness,    Final Clinical Impressions(s) / UC Diagnoses   Final diagnoses:  Acute sinusitis with symptoms > 10 days    ED Discharge Orders        Ordered    azithromycin (ZITHROMAX Z-PAK) 250 MG tablet     05/14/17 1113       Controlled Substance Prescriptions [place] Controlled Substance Registry consulted? Not Applicable   Lew Dawes, [place] 05/14/17 1210

## 2017-05-14 NOTE — ED Triage Notes (Signed)
PT C/O: cold sx  ONSET: 1 month  SX ALSO INCLUDE: facial pressure, bilateral rib pain, fatigue, hot flashes, nasal drainage/congestion   DENIES: fevers  TAKING MEDS: none  A&O x4... NAD... Ambulatory

## 2017-05-14 NOTE — Discharge Instructions (Signed)
We are treating you with azithromycin for a sinus infection. I expect your symptoms to start improving in the next 1-2 weeks. Continue to cut down on smoking.  1. Take a daily allergy pill/anti-histamine like Zyrtec, Claritin, or Store brand consistently for 2 weeks  2. For congestion you may try an oral decongestant like Mucinex or sudafed. You may also try intranasal flonase nasal spray or saline irrigations (neti pot, sinus cleanse)  3. For your sore throat you may try cepacol lozenges, salt water gargles, throat spray. Treatment of congestion may also help your sore throat.  4. For cough you may try Robitussen, Mucinex DM  5. Take Tylenol or Ibuprofen to help with pain/inflammation  6. Stay hydrated, drink plenty of fluids to keep throat coated and less irritated  For sore throat try using a honey-based tea. Use 3 teaspoons of honey with juice squeezed from half lemon. Place shaved pieces of ginger into 1/2-1 cup of water and warm over stove top. Then mix the ingredients and repeat every 4 hours as needed.  For rib pain/arm pain- try taking ibuprofen or tylenol over the counter every 8 hours, with food.   Please return if starting to experience fever, increased shortness of breath, chest pain, difficulty breathing, abdominal pain, or failure of symptoms to improve with treatment.

## 2017-09-23 ENCOUNTER — Encounter: Payer: Self-pay | Admitting: Rheumatology

## 2017-11-23 ENCOUNTER — Other Ambulatory Visit: Payer: Self-pay | Admitting: Nurse Practitioner

## 2017-11-23 DIAGNOSIS — Z1231 Encounter for screening mammogram for malignant neoplasm of breast: Secondary | ICD-10-CM

## 2017-11-25 ENCOUNTER — Other Ambulatory Visit: Payer: Self-pay | Admitting: Nurse Practitioner

## 2017-11-25 ENCOUNTER — Ambulatory Visit
Admission: RE | Admit: 2017-11-25 | Discharge: 2017-11-25 | Disposition: A | Discharge status: Home / Self Care | Payer: BC Managed Care – PPO | Source: Ambulatory Visit | Attending: Nurse Practitioner | Admitting: Nurse Practitioner

## 2017-11-25 DIAGNOSIS — R059 Cough, unspecified: Secondary | ICD-10-CM

## 2017-11-25 DIAGNOSIS — R05 Cough: Secondary | ICD-10-CM

## 2018-01-14 NOTE — Progress Notes (Signed)
Office Visit Note  Patient: Sarah Sloan             Date of Birth: Mar 16, 1956           MRN: [id number]             PCP: Arnette Felts Referring: Arnette Felts, FNP Visit Date: 01/28/2018 Occupation: [employer] at [employer]  Subjective:  Pain in multiple joints.   History of Present Illness: Sarah Sloan is a 62 y.o. female seen in consultation per request of her PCP.  According to patient she has had pain in multiple joints for several years.  She describes pain in her bilateral hands, bilateral elbows, bilateral hands.  She also complains of pain in her bilateral hip joints, bilateral knee joints and her feet.  She states she has noticed some knots on her hands.  She also had discomfort in her lower back for multiple years.  She states she was evaluated by Dr. Franky Macho several years ago who told her that she had disc herniation at L4-5 and advised her to come back if she needs surgery and cannot tolerate the pain.  He continues to have some lower back pain.  She occasionally has some neck pain.  She notices swelling in her both knees.  He has difficulty walking.  Activities of Daily Living:  Patient reports morning stiffness for 15 minutes.   Patient Reports nocturnal pain.  Difficulty dressing/grooming: Reports Difficulty climbing stairs: Reports Difficulty getting out of chair: Reports Difficulty using hands for taps, buttons, cutlery, and/or writing: Reports  Review of Systems  Constitutional: Positive for fatigue. Negative for night sweats, weight gain and weight loss.  HENT: Positive for mouth dryness. Negative for mouth sores, trouble swallowing, trouble swallowing and nose dryness.   Eyes: Positive for dryness. Negative for pain, redness and visual disturbance.  Respiratory: Positive for shortness of breath. Negative for cough and difficulty breathing.        History of chronic bronchitis  Cardiovascular: Negative for chest pain, palpitations, hypertension, irregular  heartbeat and swelling in legs/feet.  Gastrointestinal: Positive for constipation. Negative for blood in stool and diarrhea.  Endocrine: Negative for increased urination.  Genitourinary: Negative for vaginal dryness.  Musculoskeletal: Positive for arthralgias, joint pain, joint swelling and morning stiffness. Negative for myalgias, muscle weakness, muscle tenderness and myalgias.  Skin: Negative for color change, rash, hair loss, skin tightness, ulcers and sensitivity to sunlight.  Allergic/Immunologic: Negative for susceptible to infections.  Neurological: Negative for dizziness, memory loss, night sweats and weakness.  Hematological: Negative for swollen glands.  Psychiatric/Behavioral: Positive for sleep disturbance. Negative for depressed mood. The patient is not nervous/anxious.     PMFS History:  Patient Active Problem List   Diagnosis Date Noted  . Essential hypertension 01/28/2018  . Carpal tunnel syndrome, left upper limb 01/28/2018  . Tobacco use 01/28/2018  . Primary osteoarthritis of both hands 01/28/2018  . DDD (degenerative disc disease), lumbar 01/28/2018    Past Medical History:  Diagnosis Date  . Hypertension   . Rheumatoid arthritis (HCC)     Family History  Problem Relation Age of Onset  . Rheum arthritis Mother   . Rheum arthritis Sister   . Rheum arthritis Brother    Past Surgical History:  Procedure Laterality Date  . HAND SURGERY Left    x2  . TONSILLECTOMY     age 66    Social History   Social History Narrative  . Not on file    Objective: Vital  Signs: BP (!) 156/87 (BP Location: Right Arm, Patient Position: Sitting, Cuff Size: Large)   Pulse (!) 56   Resp 17   Ht 5' 6.5" (1.689 m)   Wt 268 lb (121.6 kg)   BMI 42.61 kg/m    Physical Exam  Constitutional: She is oriented to person, place, and time. She appears well-developed and well-nourished.  HENT:  Head: Normocephalic and atraumatic.  Eyes: Conjunctivae and EOM are normal.  Neck:  Normal range of motion.  Cardiovascular: Normal rate, regular rhythm, normal heart sounds and intact distal pulses.  Pulmonary/Chest: Effort normal and breath sounds normal.  Abdominal: Soft. Bowel sounds are normal.  Lymphadenopathy:    She has no cervical adenopathy.  Neurological: She is alert and oriented to person, place, and time.  Skin: Skin is warm and dry. Capillary refill takes less than 2 seconds.  Psychiatric: She has a normal mood and affect. Her behavior is normal.  Nursing note and vitals reviewed.    Musculoskeletal Exam: Spine thoracic spine good range of motion.  She discomfort range of motion of her lumbar spine with tenderness over lower lumbar region.  No SI joint tenderness was noted.  Shoulder joints elbow joints wrist joint MCPs PIPs DIPs been good range of motion.  She has some DIP and PIP thickening with some discomfort on palpation.  No synovitis was noted.  She discomfort range of motion of bilateral hip joints and knee joints.  No warmth swelling or effusion was noted over her knees.  Ankle joints MTPs PIPs been good range of motion.  She has some osteoarthritic changes in her feet.  CDAI Exam: CDAI Score: Not documented Patient Global Assessment: Not documented; Provider Global Assessment: Not documented Swollen: Not documented; Tender: Not documented Joint Exam   Not documented   There is currently no information documented on the homunculus. Go to the Rheumatology activity and complete the homunculus joint exam.  Investigation: Findings:  09/23/17: RF 10.7, ANA -, dsDNA -, C3 175, TSH 1.610, Hgb A1c 6.2, Cholesterol 180, HDL 56, LDL 109, LDL/HDL ratio 1.9, TG 75, VLDL 15, CMP glucose elevated 115, CBC-WBC 3.1   Imaging: Xr Hip Unilat W Or W/o Pelvis 2-3 Views Right  Result Date: 01/28/2018 Mild superior lateral narrowing was noted.  No chondrocalcinosis was noted.  No SI joint changes were noted. Impression: These findings are consistent with mild  osteoarthritis of the hip joint.  Xr Hips Bilat W Or W/o Pelvis 2v  Result Date: 01/28/2018 Mild superior lateral narrowing was noted.  No chondrocalcinosis was noted.  No SI joint changes were noted. Impression: These findings are consistent with mild osteoarthritis of the hip joint.  Xr Hand 2 View Left  Result Date: 01/28/2018 Hardware noted in the right radius.  PIP and DIP narrowing was noted.  No MCP intercarpal radiocarpal changes were noted.  No erosive changes were noted.  CMC narrowing was noted. Impression: These findings are consistent with osteoarthritis of the hand.  Xr Hand 2 View Right  Result Date: 01/28/2018 PIP/DIP narrowing was noted.  No MCP intercarpal radiocarpal joint space changes were noted.  No erosive changes were noted.  Some spurring was noted in the second and third MCP joint. Impression: These findings are consistent with osteoarthritis of the hand.  Xr Knee 3 View Left  Result Date: 01/28/2018 Severe medial compartment narrowing was noted.  Severe patellofemoral narrowing was noted.  No chondrocalcinosis was noted. Impression: These findings are consistent with severe osteoarthritis and severe chondromalacia patella.  Xr Knee 3 View Right  Result Date: 01/28/2018 Mild medial compartment narrowing was noted.  No chondrocalcinosis was noted.  Severe patellofemoral narrowing was noted. Impression: These findings are consistent mild osteoarthritis and severe chondromalacia patella.   Recent Labs: No results found for: WBC, HGB, PLT, NA, K, CL, CO2, GLUCOSE, BUN, CREATININE, BILITOT, ALKPHOS, AST, ALT, PROT, ALBUMIN, CALCIUM, GFRAA, QFTBGOLD, QFTBGOLDPLUS  Speciality Comments: No specialty comments available.  Procedures:  No procedures performed Allergies: Penicillins   Assessment / Plan:     Visit Diagnoses: Polyarthralgia -patient complains of pain in multiple joints for several years.  She had no synovitis on examination today.  09/23/17: RF 10.7, ANA  -, dsDNA -, C3 175, TSH 1.610, Hgb A1c 6.2  Pain in both hands -planes of intermittent swelling in her hands.  All her autoimmune work-up is negative.  There was no synovitis on examination.  Clinical findings are consistent with osteoarthritis.  Plan: XR Hand 2 View Right, XR Hand 2 View Left.  The x-ray of bilateral hands were consistent with osteoarthritis.  Primary osteoarthritis of both hands  Chronic pain of both hips -she has some discomfort range of motion of bilateral hip joints.  She also had tenderness over bilateral trochanteric bursa consistent with trochanteric bursitis.  Plan: XR HIPS BILAT W OR W/O PELVIS 2V, XR HIP UNILAT W OR W/O PELVIS 2-3 VIEWS RIGHT.  The x-ray showed mild osteoarthritic changes in bilateral hip joints.  Chronic pain of both knees -complains of chronic pain in her knee joints for several years.  She also gives history of intermittent swelling in her knees.  No warmth swelling or effusion was noted.  Plan: XR KNEE 3 VIEW RIGHT, XR KNEE 3 VIEW LEFT.  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  Left knee joint severe osteoarthritis and right knee joint mild osteoarthritis.  She was given a handout of knee exercises that she can perform at home.  Joint protection and muscle strengthening were discussed.  She declined a cortisone injection today.  We talked about the option of visco gel injections vs. Cortisone injections in the future if she continues to have discomfort.  She was given a prescription for voltaren gel that she can apply topically 3 times daily.  A goodrx coupon was also provided.  She was given a prescription for bilateral knee joint braces that she can take to [facility].   DDD (degenerative disc disease), lumbar -she gives history of chronic lower back pain.  She was evaluated by Dr. Franky Macho for L4-5 disc herniation per patient.  She has some handwritten notes on Dr. Sueanne Margarita card.  Essential hypertension-blood pressure is elevated today.   Have advised to monitor blood pressure closely.  Carpal tunnel syndrome, left upper limb - s/p release  Tobacco use -she has chronic bronchitis and uses inhalers.  Smoking cessation was discussed.  Orders: Orders Placed This Encounter  Procedures  . XR Hand 2 View Right  . XR Hand 2 View Left  . XR KNEE 3 VIEW RIGHT  . XR KNEE 3 VIEW LEFT  . XR HIPS BILAT W OR W/O PELVIS 2V  . XR HIP UNILAT W OR W/O PELVIS 2-3 VIEWS RIGHT   Meds ordered this encounter  Medications  . diclofenac sodium (VOLTAREN) 1 % GEL    Sig: Apply 3 grams to 3 large joints up to 3 times daily    Dispense:  3 Tube    Refill:  3    Face-to-face time spent with patient was  50 minutes. Greater than 50% of time was spent in counseling and coordination of care.  Follow-Up Instructions: Return in about 3 months (around 04/30/2018) for Polyarthralgia.   Pollyann Savoy, MD  Note - This record has been created using Animal nutritionist.  Chart creation errors have been sought, but may not always  have been located. Such creation errors do not reflect on  the standard of medical care.

## 2018-01-28 ENCOUNTER — Ambulatory Visit (INDEPENDENT_AMBULATORY_CARE_PROVIDER_SITE_OTHER): Payer: Self-pay

## 2018-01-28 ENCOUNTER — Ambulatory Visit (INDEPENDENT_AMBULATORY_CARE_PROVIDER_SITE_OTHER): Payer: BC Managed Care – PPO | Admitting: Rheumatology

## 2018-01-28 ENCOUNTER — Encounter: Payer: Self-pay | Admitting: Rheumatology

## 2018-01-28 ENCOUNTER — Telehealth: Payer: Self-pay | Admitting: *Deleted

## 2018-01-28 ENCOUNTER — Encounter (INDEPENDENT_AMBULATORY_CARE_PROVIDER_SITE_OTHER): Payer: Self-pay

## 2018-01-28 VITALS — BP 156/87 | HR 56 | Resp 17 | Ht 66.5 in | Wt 268.0 lb

## 2018-01-28 DIAGNOSIS — M25552 Pain in left hip: Secondary | ICD-10-CM | POA: Diagnosis not present

## 2018-01-28 DIAGNOSIS — G5602 Carpal tunnel syndrome, left upper limb: Secondary | ICD-10-CM

## 2018-01-28 DIAGNOSIS — G8929 Other chronic pain: Secondary | ICD-10-CM

## 2018-01-28 DIAGNOSIS — M79642 Pain in left hand: Secondary | ICD-10-CM

## 2018-01-28 DIAGNOSIS — M25561 Pain in right knee: Secondary | ICD-10-CM

## 2018-01-28 DIAGNOSIS — M2550 Pain in unspecified joint: Secondary | ICD-10-CM

## 2018-01-28 DIAGNOSIS — M25551 Pain in right hip: Secondary | ICD-10-CM

## 2018-01-28 DIAGNOSIS — M19041 Primary osteoarthritis, right hand: Secondary | ICD-10-CM

## 2018-01-28 DIAGNOSIS — M79641 Pain in right hand: Secondary | ICD-10-CM

## 2018-01-28 DIAGNOSIS — M5136 Other intervertebral disc degeneration, lumbar region: Secondary | ICD-10-CM

## 2018-01-28 DIAGNOSIS — I10 Essential (primary) hypertension: Secondary | ICD-10-CM | POA: Insufficient documentation

## 2018-01-28 DIAGNOSIS — M19042 Primary osteoarthritis, left hand: Secondary | ICD-10-CM

## 2018-01-28 DIAGNOSIS — M25562 Pain in left knee: Secondary | ICD-10-CM | POA: Diagnosis not present

## 2018-01-28 DIAGNOSIS — Z720 Tobacco use: Secondary | ICD-10-CM

## 2018-01-28 MED ORDER — DICLOFENAC SODIUM 1 % TD GEL: TRANSDERMAL | 3 refills | Status: AC

## 2018-01-28 NOTE — Telephone Encounter (Signed)
Prior Authorization submitted via cover my meds for Voltaren Gel. Will update once decision is reached.

## 2018-01-28 NOTE — Patient Instructions (Signed)

## 2018-02-10 NOTE — Telephone Encounter (Signed)
Prior Authorization for Voltaren Gel denied.   Attempted to contact the patient and left message to advise patient. Advised she may use good rx coupon.

## 2018-02-12 ENCOUNTER — Emergency Department (HOSPITAL_COMMUNITY)
Admission: EM | Admit: 2018-02-12 | Discharge: 2018-02-12 | Disposition: A | Discharge status: Home / Self Care | Payer: BC Managed Care – PPO | Attending: Emergency Medicine | Admitting: Emergency Medicine

## 2018-02-12 ENCOUNTER — Emergency Department (HOSPITAL_COMMUNITY): Payer: BC Managed Care – PPO

## 2018-02-12 ENCOUNTER — Other Ambulatory Visit: Payer: Self-pay

## 2018-02-12 ENCOUNTER — Encounter (HOSPITAL_COMMUNITY): Payer: Self-pay

## 2018-02-12 DIAGNOSIS — G43109 Migraine with aura, not intractable, without status migrainosus: Secondary | ICD-10-CM | POA: Insufficient documentation

## 2018-02-12 DIAGNOSIS — F17200 Nicotine dependence, unspecified, uncomplicated: Secondary | ICD-10-CM | POA: Insufficient documentation

## 2018-02-12 DIAGNOSIS — Z79899 Other long term (current) drug therapy: Secondary | ICD-10-CM | POA: Diagnosis not present

## 2018-02-12 DIAGNOSIS — R51 Headache: Secondary | ICD-10-CM | POA: Diagnosis present

## 2018-02-12 DIAGNOSIS — I10 Essential (primary) hypertension: Secondary | ICD-10-CM | POA: Insufficient documentation

## 2018-02-12 DIAGNOSIS — R4701 Aphasia: Secondary | ICD-10-CM | POA: Diagnosis not present

## 2018-02-12 DIAGNOSIS — R531 Weakness: Secondary | ICD-10-CM

## 2018-02-12 DIAGNOSIS — R4781 Slurred speech: Secondary | ICD-10-CM

## 2018-02-12 DIAGNOSIS — Z7982 Long term (current) use of aspirin: Secondary | ICD-10-CM | POA: Diagnosis not present

## 2018-02-12 LAB — CBC
HCT: 41.3 % (ref 36.0–46.0)
HEMOGLOBIN: 13.4 g/dL (ref 12.0–15.0)
MCH: 31.4 pg (ref 26.0–34.0)
MCHC: 32.4 g/dL (ref 30.0–36.0)
MCV: 96.7 fL (ref 78.0–100.0)
PLATELETS: 310 10*3/uL (ref 150–400)
RBC: 4.27 MIL/uL (ref 3.87–5.11)
RDW: 13.2 % (ref 11.5–15.5)
WBC: 5.3 10*3/uL (ref 4.0–10.5)

## 2018-02-12 LAB — DIFFERENTIAL
ABS IMMATURE GRANULOCYTES: 0 10*3/uL (ref 0.0–0.1)
BASOS PCT: 0 %
Basophils Absolute: 0 10*3/uL (ref 0.0–0.1)
EOS ABS: 0.1 10*3/uL (ref 0.0–0.7)
Eosinophils Relative: 2 %
IMMATURE GRANULOCYTES: 0 %
Lymphocytes Relative: 47 %
Lymphs Abs: 2.5 10*3/uL (ref 0.7–4.0)
MONOS PCT: 8 %
Monocytes Absolute: 0.4 10*3/uL (ref 0.1–1.0)
NEUTROS ABS: 2.3 10*3/uL (ref 1.7–7.7)
Neutrophils Relative %: 43 %

## 2018-02-12 LAB — RAPID URINE DRUG SCREEN, HOSP PERFORMED
AMPHETAMINES: NOT DETECTED
Barbiturates: NOT DETECTED
Benzodiazepines: NOT DETECTED
COCAINE: NOT DETECTED
OPIATES: NOT DETECTED
Tetrahydrocannabinol: NOT DETECTED

## 2018-02-12 LAB — COMPREHENSIVE METABOLIC PANEL
ALBUMIN: 3.9 g/dL (ref 3.5–5.0)
ALT: 16 U/L (ref 0–44)
AST: 18 U/L (ref 15–41)
Alkaline Phosphatase: 84 U/L (ref 38–126)
Anion gap: 11 (ref 5–15)
BUN: 9 mg/dL (ref 8–23)
CHLORIDE: 103 mmol/L (ref 98–111)
CO2: 24 mmol/L (ref 22–32)
Calcium: 9.4 mg/dL (ref 8.9–10.3)
Creatinine, Ser: 0.82 mg/dL (ref 0.44–1.00)
GFR calc Af Amer: >60 mL/min (ref >60)
GLUCOSE: 94 mg/dL (ref 70–99)
Potassium: 3.7 mmol/L (ref 3.5–5.1)
Sodium: 138 mmol/L (ref 135–145)
Total Bilirubin: 0.7 mg/dL (ref 0.3–1.2)
Total Protein: 7.5 g/dL (ref 6.5–8.1)

## 2018-02-12 LAB — I-STAT CHEM 8, ED
BUN: 11 mg/dL (ref 8–23)
Calcium, Ion: 1.13 mmol/L — ABNORMAL LOW (ref 1.15–1.40)
Chloride: 104 mmol/L (ref 98–111)
Creatinine, Ser: 0.8 mg/dL (ref 0.44–1.00)
GLUCOSE: 91 mg/dL (ref 70–99)
HEMATOCRIT: 41 % (ref 36.0–46.0)
HEMOGLOBIN: 13.9 g/dL (ref 12.0–15.0)
POTASSIUM: 3.6 mmol/L (ref 3.5–5.1)
SODIUM: 140 mmol/L (ref 135–145)
TCO2: 26 mmol/L (ref 22–32)

## 2018-02-12 LAB — I-STAT TROPONIN, ED: TROPONIN I, POC: 0 ng/mL (ref 0.00–0.08)

## 2018-02-12 LAB — URINALYSIS, ROUTINE W REFLEX MICROSCOPIC
Bilirubin Urine: NEGATIVE
Glucose, UA: NEGATIVE mg/dL
HGB URINE DIPSTICK: NEGATIVE
Ketones, ur: NEGATIVE mg/dL
Leukocytes, UA: NEGATIVE
NITRITE: NEGATIVE
PROTEIN: NEGATIVE mg/dL
SPECIFIC GRAVITY, URINE: 1.006 (ref 1.005–1.030)
pH: 7 (ref 5.0–8.0)

## 2018-02-12 LAB — APTT: APTT: 31 s (ref 24–36)

## 2018-02-12 LAB — CBG MONITORING, ED: Glucose-Capillary: 86 mg/dL (ref 70–99)

## 2018-02-12 LAB — PROTIME-INR
INR: 0.91
Prothrombin Time: 12.1 seconds (ref 11.4–15.2)

## 2018-02-12 MED ORDER — KETOROLAC TROMETHAMINE 15 MG/ML IJ SOLN
15.0000 mg | Freq: Once | INTRAMUSCULAR | Status: AC
  Administered 2018-02-12: 15 mg via INTRAVENOUS
  Filled 2018-02-12: qty 1

## 2018-02-12 MED ORDER — DIPHENHYDRAMINE HCL 50 MG/ML IJ SOLN
50.0000 mg | Freq: Once | INTRAMUSCULAR | Status: AC
  Administered 2018-02-12: 50 mg via INTRAVENOUS
  Filled 2018-02-12: qty 1

## 2018-02-12 MED ORDER — PROCHLORPERAZINE EDISYLATE 10 MG/2ML IJ SOLN
10.0000 mg | Freq: Once | INTRAMUSCULAR | Status: AC
  Administered 2018-02-12: 10 mg via INTRAVENOUS
  Filled 2018-02-12: qty 2

## 2018-02-12 NOTE — Consult Note (Signed)
Neurology Consultation  Reason for Consult: Acute code stroke Referring Physician: Dr. Lockie Mola  CC: Right-sided weakness/numbness  History is obtained from: Chart, patient  HPI: Sarah Sloan is a 62 y.o. female past medical history of rheumatoid arthritis, hypertension, reports compliance to medications, came in via private vehicle to the emergency room for evaluation of bizarre behavior.  She came to the front desk presumably crying and speaking as if she is speaking a foreign language mixed with English intermittently based on notes from the triage nurse.  On stroke screen, she was noted to have right-sided weakness. Denies any preceding illnesses but reports that she had a headache prior to this and her headache is continuing.  She does have a history of migraines. Denies any tingling or numbness but appears to be not able to provide complete arrival history. She reports that she looked at her phone at 11:21 AM and was feeling fine in the right next moment started feeling abnormal but could not really describe it very well.  She kept on repeating that I do want to be falling sick. Examined at the bridge, taken for stat CT scan.  LKW: 11:21 AM on 02/12/2018 tpa given?: no, NIH 0 Premorbid modified Rankin scale (mRS): 0  ROS: Obtain review of systems and documented pertinent positives in the HPI.  Rest of the review negative.  Past Medical History:  Diagnosis Date  . Hypertension   . Rheumatoid arthritis (HCC)    Family History  Problem Relation Age of Onset  . Rheum arthritis Mother   . Rheum arthritis Sister   . Rheum arthritis Brother    Social History:   reports that she has been smoking. She has never used smokeless tobacco. She reports that she drinks alcohol. She reports that she has current or past drug history.  Medications No current facility-administered medications for this encounter.   Current Outpatient Medications:  .  aspirin 81 MG chewable tablet, Chew by  mouth daily., Disp: , Rfl:  .  azithromycin (ZITHROMAX Z-PAK) 250 MG tablet, Please take 2 tablets on day one, followed by 1 tablet the following 4 days (Patient not taking: Reported on 01/28/2018), Disp: 6 tablet, Rfl: 0 .  budesonide-formoterol (SYMBICORT) 160-4.5 MCG/ACT inhaler, Inhale 2 puffs into the lungs 2 (two) times daily., Disp: , Rfl:  .  Cholecalciferol (VITAMIN D PO), Take 1 tablet by mouth once a week. , Disp: , Rfl:  .  diclofenac (VOLTAREN) 75 MG EC tablet, Take 75 mg by mouth 2 (two) times daily. , Disp: , Rfl:  .  diclofenac sodium (VOLTAREN) 1 % GEL, Apply 3 grams to 3 large joints up to 3 times daily, Disp: 3 Tube, Rfl: 3 .  GARLIC-PARSLEY PO, Take 1 tablet by mouth daily., Disp: , Rfl:  .  Ginger, Zingiber officinalis, (GINGER PO), Take 1 tablet by mouth daily., Disp: , Rfl:  .  HYDROcodone-acetaminophen (NORCO/VICODIN) 5-325 MG tablet, Take 1-2 tablets by mouth every 4 (four) hours as needed. (Patient not taking: Reported on 01/28/2018), Disp: 20 tablet, Rfl: 0 .  ibuprofen (ADVIL,MOTRIN) 600 MG tablet, Take 1 tablet (600 mg total) by mouth every 6 (six) hours as needed. (Patient taking differently: Take 800 mg by mouth every 6 (six) hours as needed. ), Disp: 30 tablet, Rfl: 0 .  Lemon Bioflavonoid (LEMON BIOFLAVANOID) POWD, Take 1 tablet by mouth daily., Disp: , Rfl:  .  losartan-hydrochlorothiazide (HYZAAR) 50-12.5 MG per tablet, Take 1 tablet by mouth daily. , Disp: , Rfl:  .  Magnesium 400 MG TABS, Take by mouth daily., Disp: , Rfl:  .  Menthol, Topical Analgesic, (BIOFREEZE EX), Apply 1 application topically 2 (two) times daily as needed (pain). , Disp: , Rfl:  .  Multiple Vitamins-Minerals (MULTIVITAMIN WITH MINERALS) tablet, Take 1 tablet by mouth daily., Disp: , Rfl:  .  olmesartan (BENICAR) 20 MG tablet, Take 20 mg by mouth daily., Disp: , Rfl: 1 .  PROAIR HFA 108 (90 Base) MCG/ACT inhaler, INHALE 2 PUFF BY MOUTH   EVERY 4  TO 6 HOURS AS NEEDED, Disp: , Rfl:  1  Exam: Current vital signs: BP (!) 172/139 (BP Location: Left Arm)   Pulse 93   Temp 98.6 F (37 C) (Axillary)   Resp (!) 22   Ht 5\' 6"  (1.676 m)   Wt 121.1 kg   SpO2 100%   BMI 43.09 kg/m  Vital signs in last 24 hours: Temp:  [98.6 F (37 C)] 98.6 F (37 C) (09/14 1202) Pulse Rate:  [93] 93 (09/14 1202) Resp:  [22] 22 (09/14 1202) BP: (172)/(139) 172/139 (09/14 1202) SpO2:  [100 %] 100 % (09/14 1202) Weight:  [121.1 kg-121.5 kg] 121.1 kg (09/14 1225) GENERAL: Awake,  appears very anxious and diaphoretic HEENT: - Normocephalic and atraumatic, dry mm, no LN++, no Thyromegally LUNGS - Clear to auscultation bilaterally with no wheezes CV - S1S2 RRR, no m/r/g, equal pulses bilaterally. ABDOMEN - Soft, nontender, nondistended with normoactive BS Ext: warm, well perfused, intact peripheral pulses, no edema  NEURO:  Mental Status: Awake, alert, oriented x3. Language: speech is stuttering.  Naming, repetition, fluency, and comprehension intact. Cranial Nerves: PERRL EOMI, visual fields full, no facial asymmetry, facial sensation intact, hearing intact, tongue/uvula/soft palate midline, normal sternocleidomastoid and trapezius muscle strength. No evidence of tongue atrophy or fibrillations Motor: Symmetric antigravity in all 4 extremities with no vertical drift.  Initial exam on the bridge showed some facial numbness that resolved after the CT scan. Tone: is normal and bulk is normal Sensation- Intact to light touch bilaterally Coordination: FTN intact bilaterally, no ataxia in BLE. Gait- deferred  NIHSS-1 for sensory  Labs I have reviewed labs in epic and the results pertinent to this consultation are:  CBC    Component Value Date/Time   WBC 5.3 02/12/2018 1214   RBC 4.27 02/12/2018 1214   HGB 13.9 02/12/2018 1238   HCT 41.0 02/12/2018 1238   PLT 310 02/12/2018 1214   MCV 96.7 02/12/2018 1214   MCH 31.4 02/12/2018 1214   MCHC 32.4 02/12/2018 1214   RDW 13.2  02/12/2018 1214   LYMPHSABS 2.5 02/12/2018 1214   MONOABS 0.4 02/12/2018 1214   EOSABS 0.1 02/12/2018 1214   BASOSABS 0.0 02/12/2018 1214    CMP     Component Value Date/Time   NA 140 02/12/2018 1238   K 3.6 02/12/2018 1238   CL 104 02/12/2018 1238   GLUCOSE 91 02/12/2018 1238   BUN 11 02/12/2018 1238   CREATININE 0.80 02/12/2018 1238   Imaging I have reviewed the images obtained:  CT-scan of the brain-no acute changes.  Age-indeterminate left caudate lacunar and advanced white matter disease.  Assessment:  62 year old woman with hypertension presenting with rather bizarre presentation of speaking in a language other than English mixed with English at the ER desk but on my evaluation had no aphasia but had a little bit of stuttering speech to begin with that had also resolved. NIH stroke scale was 1 for mild sensory deficit on the right side  initially. Not a candidate for TPA due to mild symptoms Not a candidate for endovascular due to no cortical signs. Less likely to be a stroke.  She says that her symptoms were preceded by headache and she has history of migraines.  Compass migraine more likely.  Impression: History of migraines-likely complex migraine Evaluate for stroke-less likely  Recommendations: Symptomatic treatment with cocktail for migraine. MRI of the brain without contrast to rule out acute ischemic stroke. If the MRI is negative and the symptoms are resolved with a migraine cocktail, can be discharged home with outpatient neurology follow-up. In the event that the MRI shows a stroke, needs to admission for stroke risk factor work-up.  -- Milon Dikes, MD [facility] Pager: [phone] If 7pm to 7am, please call on call as listed on AMION.  CRITICAL CARE ATTESTATION This patient is critically ill and at significant risk of neurological worsening, death and care requires constant monitoring of vital signs, hemodynamics, respiratory, and cardiac  monitoring. I spent 35  minutes of neurocritical care time performing neurological assessment, discussion with family, other specialists and medical decision making of high complexity in the care of  this patient.

## 2018-02-12 NOTE — ED Notes (Signed)
Activated code stroke with [facility] @ 12:17

## 2018-02-12 NOTE — Discharge Instructions (Addendum)
Follow up with your doctor at your scheduled appointment this week regarding your back pain and today's events.  Continue taking your home medications as prescribed. Return to the emergency room if you develop vision changes, slurred speech, numbness/weakness, or any new or concerning symptoms.

## 2018-02-12 NOTE — Code Documentation (Signed)
62 year old female presents to [facility] via private vehicle.  On arrival she is screaming and crying at triage desk.  RN reports she begins speaking what sounds like multiple different languages with intermittent English.  Noted right grip weakness but no drift noted.  She was hypertensive.  Stroke was called at triage.  Patient was met in the CT scan room by the stroke team.  She was alert, crying, speech was fragmented with slow response and stuttering speech.   Patient reports she was driving to [employer], stopped to go to restroom and began to "feel funny - like the life was drained out of me."  She then became anxious - afraid she was having a heart attack and had her husband drive her to the ED.  CT scan was done.  Post scan patient was more calm, was light sensitive - endorses headache now - with history of migraines.  NIHHS 0 - no focal deficits.  Dr. Rory Percy at the bedside.  Will treat as migraine.  Handoff to Sheridan RN.  To call as needed.

## 2018-02-12 NOTE — ED Provider Notes (Signed)
[facility] EMERGENCY DEPARTMENT Provider Note   CSN: [id number] Arrival date & time: 02/12/18  1155   An emergency department physician performed an initial assessment on this suspected stroke patient at 1219.  History   Chief Complaint Chief Complaint  Patient presents with  . Code Stroke    HPI Sarah Sloan is a 62 y.o. female presenting for evaluation of headache.  Patient states that she woke up this morning, she had worsening of her chronic back pain.  While she was driving to the [employer], she had worsening headache, and suddenly felt poorly.  She states she felt sick, but is unable to describe exactly what was going on.  Per triage note, when patient arrived to the ER she was speaking in what sounded like a foreign language with mixture of English.  She was very agitated and upset.  When the patient was in the room, she reports her speech does not sound normal to her, is more started than normal.  She also warts left-sided decreased facial sensation.  However, she reports her symptoms are improved from earlier today.  She denies vision changes, numbness, chest pain, shortness of breath, nausea, vomiting, abdominal pain, urinary symptoms, abnormal bowel movements.  Patient denies change in medication recently.  She has a history of asthma, RA, hypertension, chronic back pain, degenerative disc disease.  She denies alcohol or drug use.  She smokes a half a pack of cigarettes a day.  HPI  Past Medical History:  Diagnosis Date  . Hypertension   . Rheumatoid arthritis [facility])     Patient Active Problem List   Diagnosis Date Noted  . Essential hypertension 01/28/2018  . Carpal tunnel syndrome, left upper limb 01/28/2018  . Tobacco use 01/28/2018  . Primary osteoarthritis of both hands 01/28/2018  . DDD (degenerative disc disease), lumbar 01/28/2018    Past Surgical History:  Procedure Laterality Date  . HAND SURGERY Left    x2  . TONSILLECTOMY     age 66      OB History   None      Home Medications    Prior to Admission medications   Medication Sig Start Date End Date Taking? Authorizing Provider  aspirin 81 MG chewable tablet Chew by mouth daily.   Yes [provider]  budesonide-formoterol (SYMBICORT) 160-4.5 MCG/ACT inhaler Inhale 2 puffs into the lungs 2 (two) times daily.   Yes [provider]  Cholecalciferol (VITAMIN D PO) Take 1 tablet by mouth once a week.    Yes [provider]  diclofenac sodium (VOLTAREN) 1 % GEL Apply 3 grams to 3 large joints up to 3 times daily 01/28/18  Yes Gearldine Bienenstock, PA-C  GARLIC-PARSLEY PO Take 1 tablet by mouth daily.   Yes [provider]  Ginger, Zingiber officinalis, (GINGER PO) Take 1 tablet by mouth daily.   Yes [provider]  Magnesium 400 MG TABS Take by mouth daily.   Yes [provider]  Multiple Vitamins-Minerals (MULTIVITAMIN WITH MINERALS) tablet Take 1 tablet by mouth daily.   Yes [provider]  olmesartan (BENICAR) 20 MG tablet Take 20 mg by mouth daily. 01/01/18  Yes [provider]  PROAIR HFA 108 (90 Base) MCG/ACT inhaler Inhale 2 puffs into the lungs every 6 (six) hours as needed.  11/02/17  Yes [provider]  azithromycin (ZITHROMAX Z-PAK) 250 MG tablet Please take 2 tablets on day one, followed by 1 tablet the following 4 days Patient  not taking: Reported on 01/28/2018 05/14/17   Wieters, Fran Lowes C, PA-C  HYDROcodone-acetaminophen (NORCO/VICODIN) 5-325 MG tablet Take 1-2 tablets by mouth every 4 (four) hours as needed. Patient not taking: Reported on 01/28/2018 06/17/16   Marlon Pel, PA-C  ibuprofen (ADVIL,MOTRIN) 600 MG tablet Take 1 tablet (600 mg total) by mouth every 6 (six) hours as needed. Patient taking differently: Take 800 mg by mouth every 6 (six) hours as needed.  06/17/16   Marlon Pel, PA-C    Family History Family History  Problem Relation Age of Onset  . Rheum  arthritis Mother   . Rheum arthritis Sister   . Rheum arthritis Brother     Social History Social History   Tobacco Use  . Smoking status: Current Some Day Smoker  . Smokeless tobacco: Never Used  Substance Use Topics  . Alcohol use: Yes    Comment: occ  . Drug use: Not Currently     Allergies   Penicillins   Review of Systems Review of Systems  Neurological: Positive for speech difficulty and numbness.       Abnormal left-sided facial sensation.  Speech difficulty without obvious a aphasia  Psychiatric/Behavioral: Positive for agitation.  All other systems reviewed and are negative.    Physical Exam Updated Vital Signs BP (!) 139/52   Pulse (!) 48   Temp 97.8 F (36.6 C) (Oral)   Resp 16   Ht 5\' 6"  (1.676 m)   Wt 121.1 kg   SpO2 100%   BMI 43.09 kg/m   Physical Exam  Constitutional: She is oriented to person, place, and time. She appears well-developed and well-nourished. No distress.  Appears in no distress  HENT:  Head: Normocephalic and atraumatic.  OP clear without tonsillar swelling or exudate.  Uvula midline with equal palate rise.  TMs nonerythematous and not bulging bilaterally.  Eyes: Pupils are equal, round, and reactive to light. Conjunctivae are normal.  Difficulty with EOM  Neck: Normal range of motion. Neck supple.  Cardiovascular: Normal rate, regular rhythm and intact distal pulses.  Pulmonary/Chest: Effort normal and breath sounds normal. No respiratory distress. She has no wheezes.  Speaking in full sentences.  Clear lung sounds in all fields.  Abdominal: Soft. She exhibits no distension and no mass. There is no tenderness. There is no rebound and no guarding.  Musculoskeletal: Normal range of motion.  Strength intact x4.  Sensation intact x4.  Radial and pedal pulses intact bilaterally. Soft compartments  Neurological: She is alert and oriented to person, place, and time. She has normal strength. A sensory deficit (reported) is present.  No cranial nerve deficit. Coordination normal. GCS eye subscore is 4. GCS verbal subscore is 5. GCS motor subscore is 6.  A&O x4.  Delayed speech without obvious a aphasia.  Patient reports decreased sensation of the left side of the face.  No obvious neurologic deficits, grip strength intact.  Nose to finger intact.  Fine movement and coordination intact  Skin: Skin is warm and dry. Capillary refill takes less than 2 seconds.  Psychiatric: Her mood appears anxious. Her speech is delayed.  Pt is very anxious about being sick and the cost of being in the hospital.  Nursing note and vitals reviewed.    ED Treatments / Results  Labs (all labs ordered are listed, but only abnormal results are displayed) Labs Reviewed  URINALYSIS, ROUTINE W REFLEX MICROSCOPIC - Abnormal; Notable for the following components:      Result Value   Color, Urine  STRAW (*)    All other components within normal limits  I-STAT CHEM 8, ED - Abnormal; Notable for the following components:   Calcium, Ion 1.13 (*)    All other components within normal limits  PROTIME-INR  APTT  CBC  DIFFERENTIAL  COMPREHENSIVE METABOLIC PANEL  RAPID URINE DRUG SCREEN, HOSP PERFORMED  I-STAT TROPONIN, ED  CBG MONITORING, ED    EKG EKG Interpretation  Date/Time:  Saturday February 12 2018 12:12:51 EDT Ventricular Rate:  63 PR Interval:  144 QRS Duration: 80 QT Interval:  394 QTC Calculation: 403 R Axis:   43 Text Interpretation:  Normal sinus rhythm Confirmed by Virgina Norfolk [phone]) on 02/12/2018 2:07:04 PM   Radiology Mr Brain Wo Contrast  Result Date: 02/12/2018 CLINICAL DATA:  Focal neuro deficit with stroke suspected EXAM: MRI HEAD WITHOUT CONTRAST TECHNIQUE: Multiplanar, multiecho pulse sequences of the brain and surrounding structures were obtained without intravenous contrast. COMPARISON:  Head CT earlier today FINDINGS: Brain: No acute infarction, hemorrhage, hydrocephalus, extra-axial collection or mass lesion.  Lacunar infarct in the left caudate described on prior head CT is remote. Confluent FLAIR hyperintensity in the cerebral white matter presumably from patient's hypertension history. Minimal FLAIR hyperintensity in the deep cerebellar white matter is presumably related to the same. Negative for PRES. Negative for atrophy. No chronic blood products. Vascular: Major flow voids are preserved Skull and upper cervical spine: No evident marrow lesion Sinuses/Orbits: Negative IMPRESSION: 1. No acute finding. 2. 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 Electronically Signed   By: Marnee Spring M.D.   On: 02/12/2018 14:13   Ct Head Code Stroke Wo Contrast  Result Date: 02/12/2018 CLINICAL DATA:  Code stroke. Left arm numbness and left facial droop EXAM: CT HEAD WITHOUT CONTRAST TECHNIQUE: Contiguous axial images were obtained from the base of the skull through the vertex without intravenous contrast. COMPARISON:  None available FINDINGS: Brain: Confluent low-density in the cerebral white matter, most likely chronic small vessel ischemia given patient's history of hypertension. Lacunar infarct in the left caudate, age-indeterminate. Normal brain volume. No hemorrhage, hydrocephalus, or masslike finding Vascular: Atherosclerotic calcification Skull: No acute finding Sinuses/Orbits: Negative Other: These results were communicated to Dr. Wilford Corner at 12:39 pmon 9/14/2019by text page via the [facility] messaging system. ASPECTS (Alberta Stroke Program Early CT Score) - Ganglionic level infarction (caudate, lentiform nuclei, internal capsule, insula, M1-M3 cortex): 6 - Supraganglionic infarction (M4-M6 cortex): 3 Total score (0-10 with 10 being normal): 9 IMPRESSION: 1. Age-indeterminate lacunar infarct in the left caudate. 2. Advanced white matter disease likely related to patient's history of hypertension. Electronically Signed   By: Marnee Spring M.D.   On: 02/12/2018 12:39    Procedures Procedures  (including critical care time)  Medications Ordered in ED Medications  diphenhydrAMINE (BENADRYL) injection 50 mg (50 mg Intravenous Given 02/12/18 1440)  prochlorperazine (COMPAZINE) injection 10 mg (10 mg Intravenous Given 02/12/18 1440)  ketorolac (TORADOL) 15 MG/ML injection 15 mg (15 mg Intravenous Given 02/12/18 1440)     Initial Impression / Assessment and Plan / ED Course  I have reviewed the triage vital signs and the nursing notes.  Pertinent labs & imaging results that were available during my care of the patient were reviewed by me and considered in my medical decision making (see chart for details).     Patient presenting for evaluation of headache and abnormal speech.  Physical exam reassuring, symptoms are improving.  On initial evaluation, speech is delayed, but no obvious aphasia.  No objective focal neuro deficits, patient reporting decreased sensation of her face, although reports it is better than it was.  Code stroke was called prior to my evaluation.  Labs reassuring, no leukocytosis.  Hemoglobin stable.  Potassium stable.  Creatinine stable.  Urine without infection.  UDS negative.  Troponin negative.  EKG normal sinus rhythm.  CT head negative for acute findings, shows lacunar stroke with age-indeterminate on the left side.  Discussed with Dr. Jerrell Belfast from neurology, who recommends MRI, however believes this is more likely due to a complex migraine, which patient has a history of.  States that if MRI is normal, should discharge patient with dx of complex migraine.  If MRI is abnormal, admit.  MRI reassuring/nonacute.  Headache cocktail given, patient reports improvement of symptoms.  Headache has resolved, and speech is improving.  Sensation is back to normal.  Discussed findings with patient.  Discussed symptoms are likely due to a complex migraine.  Patient has an appointment with her primary care doctor this week, patient encouraged to follow-up.  Heart rate low, per chart  review, patient is normally around 50.  Has received multiple medicines including Benadryl which could lower her heart rate.  No chest pain or shortness of breath.  Case discussed with attending, Dr. Lockie Mola evaluated patient.  At this time, patient appears safe for discharge.  Return precautions given.  Patient states she understands and agrees plan.   Final Clinical Impressions(s) / ED Diagnoses   Final diagnoses:  Slurred speech  Complicated migraine    ED Discharge Orders    None       Alveria Apley, PA-C 02/12/18 1840    Virgina Norfolk, DO 02/12/18 2154

## 2018-02-12 NOTE — ED Triage Notes (Signed)
Pt presented to front desk screaming, crying and speaking in a foreign language as well as english intermittently. Pt states "I don't want to be sick or in the hospital" Pt is following most commands but is very anxious. Hypertensive.

## 2018-02-12 NOTE — ED Notes (Signed)
Pt family is in lobby waiting

## 2018-02-12 NOTE — ED Notes (Signed)
Pt going to MRI at this time.

## 2018-02-17 DIAGNOSIS — Z1211 Encounter for screening for malignant neoplasm of colon: Secondary | ICD-10-CM | POA: Diagnosis not present

## 2018-02-17 DIAGNOSIS — N939 Abnormal uterine and vaginal bleeding, unspecified: Secondary | ICD-10-CM | POA: Diagnosis not present

## 2018-02-17 DIAGNOSIS — E669 Obesity, unspecified: Secondary | ICD-10-CM | POA: Diagnosis not present

## 2018-02-17 DIAGNOSIS — Z23 Encounter for immunization: Secondary | ICD-10-CM | POA: Diagnosis not present

## 2018-02-17 DIAGNOSIS — Z720 Tobacco use: Secondary | ICD-10-CM

## 2018-02-17 DIAGNOSIS — R42 Dizziness and giddiness: Secondary | ICD-10-CM | POA: Diagnosis not present

## 2018-02-17 DIAGNOSIS — Z01411 Encounter for gynecological examination (general) (routine) with abnormal findings: Secondary | ICD-10-CM | POA: Diagnosis not present

## 2018-02-17 DIAGNOSIS — Z1231 Encounter for screening mammogram for malignant neoplasm of breast: Secondary | ICD-10-CM | POA: Diagnosis not present

## 2018-02-17 DIAGNOSIS — Z0000 Encounter for general adult medical examination without abnormal findings: Secondary | ICD-10-CM | POA: Diagnosis not present

## 2018-02-17 DIAGNOSIS — I10 Essential (primary) hypertension: Secondary | ICD-10-CM | POA: Diagnosis not present

## 2018-03-03 ENCOUNTER — Ambulatory Visit: Payer: Self-pay | Admitting: Rheumatology

## 2018-03-04 ENCOUNTER — Ambulatory Visit
Admission: RE | Admit: 2018-03-04 | Discharge: 2018-03-04 | Disposition: A | Discharge status: Home / Self Care | Payer: BC Managed Care – PPO | Source: Ambulatory Visit | Attending: Nurse Practitioner | Admitting: Nurse Practitioner

## 2018-03-04 DIAGNOSIS — Z1231 Encounter for screening mammogram for malignant neoplasm of breast: Secondary | ICD-10-CM

## 2018-04-07 LAB — HM COLONOSCOPY

## 2018-04-08 ENCOUNTER — Encounter: Payer: Self-pay | Admitting: Nurse Practitioner

## 2018-04-20 ENCOUNTER — Ambulatory Visit: Payer: BC Managed Care – PPO | Admitting: Nurse Practitioner

## 2018-04-20 ENCOUNTER — Encounter: Payer: Self-pay | Admitting: Nurse Practitioner

## 2018-04-20 ENCOUNTER — Other Ambulatory Visit: Payer: Self-pay | Admitting: Nurse Practitioner

## 2018-04-20 VITALS — BP 120/70 | HR 88 | Temp 97.8°F | Ht 66.0 in | Wt 263.2 lb

## 2018-04-20 DIAGNOSIS — Z6841 Body Mass Index (BMI) 40.0 and over, adult: Secondary | ICD-10-CM

## 2018-04-20 DIAGNOSIS — Z7982 Long term (current) use of aspirin: Secondary | ICD-10-CM

## 2018-04-20 DIAGNOSIS — M2550 Pain in unspecified joint: Secondary | ICD-10-CM

## 2018-04-20 DIAGNOSIS — M5126 Other intervertebral disc displacement, lumbar region: Secondary | ICD-10-CM

## 2018-04-20 MED ORDER — SAXENDA 18 MG/3ML ~~LOC~~ SOPN: 3.0000 mg | PEN_INJECTOR | Freq: Every day | SUBCUTANEOUS | 1 refills | Status: DC

## 2018-04-20 NOTE — Progress Notes (Signed)
Subjective:     Patient ID: Sarah Sloan , female    DOB: March 29, 1956 , 62 y.o.   MRN: [id number]   Chief Complaint  Patient presents with  . Follow-up    weight check     HPI   Obesity - she is taking Saxenda 3 mg daily, she will get nauseated when she eats too much.      Past Medical History:  Diagnosis Date  . Hypertension   . Rheumatoid arthritis (HCC)      Family History  Problem Relation Age of Onset  . Rheum arthritis Mother   . Rheum arthritis Sister   . Rheum arthritis Brother   . Breast cancer Maternal Grandmother 34     Current Outpatient Medications:  .  aspirin 81 MG chewable tablet, Chew by mouth daily., Disp: , Rfl:  .  budesonide-formoterol (SYMBICORT) 160-4.5 MCG/ACT inhaler, Inhale 2 puffs into the lungs 2 (two) times daily., Disp: , Rfl:  .  Cholecalciferol (VITAMIN D PO), Take 1 tablet by mouth once a week. , Disp: , Rfl:  .  diclofenac sodium (VOLTAREN) 1 % GEL, Apply 3 grams to 3 large joints up to 3 times daily, Disp: 3 Tube, Rfl: 3 .  GARLIC-PARSLEY PO, Take 1 tablet by mouth daily., Disp: , Rfl:  .  Ginger, Zingiber officinalis, (GINGER PO), Take 1 tablet by mouth daily., Disp: , Rfl:  .  ibuprofen (ADVIL,MOTRIN) 600 MG tablet, Take 1 tablet (600 mg total) by mouth every 6 (six) hours as needed. (Patient taking differently: Take 800 mg by mouth every 6 (six) hours as needed. ), Disp: 30 tablet, Rfl: 0 .  Magnesium 400 MG TABS, Take by mouth daily., Disp: , Rfl:  .  Multiple Vitamins-Minerals (MULTIVITAMIN WITH MINERALS) tablet, Take 1 tablet by mouth daily., Disp: , Rfl:  .  olmesartan (BENICAR) 20 MG tablet, Take 20 mg by mouth daily., Disp: , Rfl: 1 .  PROAIR HFA 108 (90 Base) MCG/ACT inhaler, Inhale 2 puffs into the lungs every 6 (six) hours as needed. , Disp: , Rfl: 1 .  SAXENDA 18 MG/3ML SOPN, INJECT 3 MG UNDER THE SKIN   EVERY DAY IN THE ABDOMEN, THIGH, OR UPPER ARM, Disp: , Rfl: 1   Allergies  Allergen Reactions  . Penicillins Other (See  Comments)    Causes yeast infections Has patient had a PCN reaction causing immediate rash, facial/tongue/throat swelling, SOB or lightheadedness with hypotension: no Has patient had a PCN reaction causing severe rash involving mucus membranes or skin necrosis: no Has patient had a PCN reaction that required hospitalization : no Has patient had a PCN reaction occurring within the last 10 years: yes If all of the above answers are "NO", then may proceed with Cephalosporin use.      Review of Systems  Constitutional: Negative.   Respiratory: Negative.   Cardiovascular: Negative.   Musculoskeletal: Positive for arthralgias and myalgias. Negative for neck pain.  Skin: Negative.   Neurological: Negative for dizziness.     Today's Vitals   04/20/18 1530  BP: 120/70  Pulse: 88  Temp: 97.8 F (36.6 C)  TempSrc: Oral  SpO2: 95%  Weight: 263 lb 3.2 oz (119.4 kg)  Height: 5\' 6"  (1.676 m)   Body mass index is 42.48 kg/m.   Objective:  Physical Exam  Constitutional: She is oriented to person, place, and time. She appears well-developed and well-nourished.  Cardiovascular: Normal rate, regular rhythm and normal heart sounds.  Pulmonary/Chest: Effort normal  and breath sounds normal.  Musculoskeletal: She exhibits tenderness.  Neurological: She is alert and oriented to person, place, and time.  Skin: Skin is warm and dry.        Assessment And Plan:     1. Class 3 severe obesity due to excess calories without serious comorbidity with body mass index (BMI) of 40.0 to 44.9 in adult [facility]) . Maintain a BMI < 30. Portion control and Pedometer are strongly encouraged (10000steps per day) .  Marland Kitchen Exercise 177minutes/week(30 minutes 5 days a errk of some form of aerobic exercise) . Eat 5-6 servings of fruits and vegetables a day.  . Do not Smoke. . Continue with Bernie Covey if continue to be nauseated cut back to previous dose and fix plate on salad saucer plate.  - SAXENDA 18 MG/3ML SOPN;  Inject 3 mg into the skin daily.  Dispense: 5 pen; Refill: 1  2. Polyarthralgia  Continue with follow up with Rheumatology and pain cream  3. Lumbar herniated disc  Previous MRI of lumbar spine in 2014 revealed spinal stenosis and bulging disc to L4-L5  Will refer to neurosurgeon for further evaluation - Ambulatory referral to Neurosurgery       Arnette Felts, FNP

## 2018-04-27 ENCOUNTER — Telehealth: Payer: Self-pay

## 2018-04-27 NOTE — Telephone Encounter (Signed)
Nikki from  [facility] called stating pt has an appointment on Monday at 2:30pm but they are unable to find the referral so they would like it to be faxed over again to [phone].  Returned her call and spoke with Kathie Rhodes and advised her that our referral coordinator is not here to resend the referral and I have faxed over the office notes and if anything else is missing they would have to wait until the referral coordinator to return on Monday. YRL,RMA

## 2018-05-02 ENCOUNTER — Ambulatory Visit: Payer: BC Managed Care – PPO | Admitting: Neurology

## 2018-05-03 DIAGNOSIS — M170 Bilateral primary osteoarthritis of knee: Secondary | ICD-10-CM | POA: Insufficient documentation

## 2018-05-03 DIAGNOSIS — M160 Bilateral primary osteoarthritis of hip: Secondary | ICD-10-CM | POA: Insufficient documentation

## 2018-05-03 NOTE — Progress Notes (Signed)
Office Visit Note  Patient: Sarah Sloan             Date of Birth: [birth year]           MRN: [id number]             PCP: Arnette Felts, FNP Referring: Arnette Felts, FNP Visit Date: 05/12/2018 Occupation: @GUAROCC @  Subjective:  Pain in multiple joints  History of Present Illness: NYAIRE RIGHETTI is a 62 y.o. female with history of osteoarthritis and DDD.  Patient reports that she has severe pain and discomfort in her left knee joint.  She has never had cortisone or Visco supplement injections in the past.  She has been also experiencing pain in her both hips.  She is osteoarthritis in her hands which continues to hurt.  She had left carpal tunnel release in the past which still causes discomfort.  She also has lower back pain.  Activities of Daily Living:  Patient reports morning stiffness for 24 hours.   Patient Denies nocturnal pain.  Difficulty dressing/grooming: Reports Difficulty climbing stairs: Reports Difficulty getting out of chair: Reports Difficulty using hands for taps, buttons, cutlery, and/or writing: Reports  Review of Systems  Constitutional: Positive for fatigue.  HENT: Positive for mouth dryness. Negative for mouth sores and nose dryness.   Eyes: Positive for dryness. Negative for pain and visual disturbance.  Respiratory: Negative for cough, hemoptysis, shortness of breath and difficulty breathing.   Cardiovascular: Negative for chest pain, palpitations, hypertension and swelling in legs/feet.  Gastrointestinal: Positive for constipation. Negative for blood in stool and diarrhea.  Endocrine: Negative for increased urination.  Genitourinary: Negative for difficulty urinating and painful urination.  Musculoskeletal: Positive for arthralgias, joint pain, morning stiffness and muscle tenderness. Negative for joint swelling, myalgias, muscle weakness and myalgias.  Skin: Negative for color change, pallor, rash, hair loss, nodules/bumps, skin tightness, ulcers and  sensitivity to sunlight.  Allergic/Immunologic: Negative for susceptible to infections.  Neurological: Negative for dizziness and headaches.  Hematological: Negative for swollen glands.  Psychiatric/Behavioral: Positive for sleep disturbance. Negative for depressed mood. The patient is not nervous/anxious.     PMFS History:  Patient Active Problem List   Diagnosis Date Noted  . Primary osteoarthritis of both hips 05/03/2018  . Primary osteoarthritis of both knees 05/03/2018  . Essential hypertension 01/28/2018  . Carpal tunnel syndrome, left upper limb 01/28/2018  . Tobacco use 01/28/2018  . Primary osteoarthritis of both hands 01/28/2018  . DDD (degenerative disc disease), lumbar 01/28/2018    Past Medical History:  Diagnosis Date  . Hypertension   . Rheumatoid arthritis (HCC)     Family History  Problem Relation Age of Onset  . Rheum arthritis Mother   . Rheum arthritis Sister   . Rheum arthritis Brother   . Breast cancer Maternal Grandmother 27   Past Surgical History:  Procedure Laterality Date  . HAND SURGERY Left    x2  . TONSILLECTOMY     age 69    Social History   Social History Narrative  . Not on file    Objective: Vital Signs: BP 117/67 (BP Location: Left Arm, Patient Position: Sitting, Cuff Size: Normal)   Pulse 80   Resp 18   Ht 5' 8.5" (1.74 m)   Wt 259 lb (117.5 kg)   BMI 38.81 kg/m    Physical Exam Vitals signs and nursing note reviewed.  Constitutional:      Appearance: She is well-developed.  HENT:  Head: Normocephalic and atraumatic.  Eyes:     Conjunctiva/sclera: Conjunctivae normal.  Neck:     Musculoskeletal: Normal range of motion.  Cardiovascular:     Rate and Rhythm: Normal rate and regular rhythm.     Heart sounds: Normal heart sounds.  Pulmonary:     Effort: Pulmonary effort is normal.     Breath sounds: Normal breath sounds.  Abdominal:     General: Bowel sounds are normal.     Palpations: Abdomen is soft.    Lymphadenopathy:     Cervical: No cervical adenopathy.  Skin:    General: Skin is warm and dry.     Capillary Refill: Capillary refill takes less than 2 seconds.  Neurological:     Mental Status: She is alert and oriented to person, place, and time.  Psychiatric:        Behavior: Behavior normal.      Musculoskeletal Exam: C-spine was in good range of motion.  She has limited painful range of motion of lumbar spine.  Shoulder joints elbow joints wrist joints were in good range of motion.  She has DIP and PIP thickening in her hands with no synovitis.  She has some discomfort range of motion of her hip joints.  She had crepitus and discomfort range of motion of her left knee joint.  Right knee joint was in good range of motion, crepitus was noted. CDAI Exam: CDAI Score: Not documented Patient Global Assessment: Not documented; Provider Global Assessment: Not documented Swollen: Not documented; Tender: Not documented Joint Exam   Not documented   There is currently no information documented on the homunculus. Go to the Rheumatology activity and complete the homunculus joint exam.  Investigation: No additional findings.  Imaging: No results found.  Recent Labs: Lab Results  Component Value Date   WBC 5.3 02/12/2018   HGB 13.9 02/12/2018   PLT 310 02/12/2018   NA 140 02/12/2018   K 3.6 02/12/2018   CL 104 02/12/2018   CO2 24 02/12/2018   GLUCOSE 91 02/12/2018   BUN 11 02/12/2018   CREATININE 0.80 02/12/2018   BILITOT 0.7 02/12/2018   ALKPHOS 84 02/12/2018   AST 18 02/12/2018   ALT 16 02/12/2018   PROT 7.5 02/12/2018   ALBUMIN 3.9 02/12/2018   CALCIUM 9.4 02/12/2018   GFRAA >60 02/12/2018    Speciality Comments: No specialty comments available.  Procedures:  No procedures performed Allergies: Penicillins   Assessment / Plan:     Visit Diagnoses: Primary osteoarthritis of both hands -patient has DIP and PIP thickening but no synovitis on examination.  All  autoimmune work-up has been negative.  Joint protection muscle strengthening was discussed.  September 23, 2017 RF, ANA, double-stranded DNA negative  Primary osteoarthritis of both hips - Mild.  She had some limitation of range of motion with minimal discomfort.  Primary osteoarthritis of both knees - Right knee joint mild osteoarthritis and severe chondromalacia patella, left knee joint severe osteoarthritis and severe chondromalacia patella.  She has been having severe pain and discomfort in her left knee joint and difficulty walking.  I detailed discussion with patient regarding her x-ray findings.  Total knee replacement was also discussed at length.  She states she would not be able to have total knee replacement at this point.  She is interested in getting cortisone injection or Visco supplement injections.  We will start with cortisone injection.  Patient states that she is prediabetic and would like to apply for Visco supplement  injections.  Today after informed consent was obtained left knee joint was injected with cortisone as described above.  She tolerated the procedure well.  Have advised her to monitor blood sugar closely.  DDD (degenerative disc disease), lumbar - L4-5 disc herniation.  Followed up by Dr. Franky Macho  Carpal tunnel syndrome, left upper limb-he still has some intermittent discomfort.  Essential hypertension-blood pressure is well controlled.  Tobacco use -smoking cessation discussed.  Orders: No orders of the defined types were placed in this encounter.  No orders of the defined types were placed in this encounter.  .  Follow-Up Instructions: Return in about 3 months (around 08/11/2018) for Osteoarthritis.   Pollyann Savoy, MD  Note - This record has been created using Animal nutritionist.  Chart creation errors have been sought, but may not always  have been located. Such creation errors do not reflect on  the standard of medical care.

## 2018-05-12 ENCOUNTER — Telehealth: Payer: Self-pay | Admitting: *Deleted

## 2018-05-12 ENCOUNTER — Encounter: Payer: Self-pay | Admitting: Rheumatology

## 2018-05-12 ENCOUNTER — Ambulatory Visit: Payer: BC Managed Care – PPO | Admitting: Rheumatology

## 2018-05-12 VITALS — BP 117/67 | HR 80 | Resp 18 | Ht 68.5 in | Wt 259.0 lb

## 2018-05-12 DIAGNOSIS — M5136 Other intervertebral disc degeneration, lumbar region: Secondary | ICD-10-CM | POA: Diagnosis not present

## 2018-05-12 DIAGNOSIS — I10 Essential (primary) hypertension: Secondary | ICD-10-CM

## 2018-05-12 DIAGNOSIS — M160 Bilateral primary osteoarthritis of hip: Secondary | ICD-10-CM

## 2018-05-12 DIAGNOSIS — M170 Bilateral primary osteoarthritis of knee: Secondary | ICD-10-CM | POA: Diagnosis not present

## 2018-05-12 DIAGNOSIS — Z720 Tobacco use: Secondary | ICD-10-CM

## 2018-05-12 DIAGNOSIS — M19041 Primary osteoarthritis, right hand: Secondary | ICD-10-CM | POA: Diagnosis not present

## 2018-05-12 DIAGNOSIS — M19042 Primary osteoarthritis, left hand: Secondary | ICD-10-CM

## 2018-05-12 DIAGNOSIS — G5602 Carpal tunnel syndrome, left upper limb: Secondary | ICD-10-CM

## 2018-05-12 DIAGNOSIS — M51369 Other intervertebral disc degeneration, lumbar region without mention of lumbar back pain or lower extremity pain: Secondary | ICD-10-CM

## 2018-05-12 NOTE — Telephone Encounter (Signed)
Please apply for left knee visco w/[facility], patient is prediabetic. Thank you.

## 2018-05-12 NOTE — Patient Instructions (Signed)

## 2018-05-12 NOTE — Telephone Encounter (Signed)
Noted  

## 2018-05-17 ENCOUNTER — Other Ambulatory Visit (HOSPITAL_COMMUNITY)
Admission: RE | Admit: 2018-05-17 | Discharge: 2018-05-17 | Disposition: A | Discharge status: Home / Self Care | Payer: BC Managed Care – PPO | Source: Ambulatory Visit | Attending: Obstetrics and Gynecology | Admitting: Obstetrics and Gynecology

## 2018-05-17 ENCOUNTER — Other Ambulatory Visit: Payer: Self-pay | Admitting: Obstetrics and Gynecology

## 2018-05-17 DIAGNOSIS — Z01411 Encounter for gynecological examination (general) (routine) with abnormal findings: Secondary | ICD-10-CM | POA: Diagnosis present

## 2018-05-18 LAB — CYTOLOGY - PAP
ADEQUACY: ABSENT
DIAGNOSIS: NEGATIVE
HPV (WINDOPATH): NOT DETECTED

## 2018-05-24 ENCOUNTER — Encounter: Payer: Self-pay | Admitting: Nurse Practitioner

## 2018-06-15 ENCOUNTER — Ambulatory Visit: Payer: BC Managed Care – PPO | Admitting: Neurology

## 2018-06-15 ENCOUNTER — Encounter: Payer: Self-pay | Admitting: Neurology

## 2018-06-15 ENCOUNTER — Other Ambulatory Visit: Payer: Self-pay | Admitting: Nurse Practitioner

## 2018-06-15 VITALS — Ht 66.0 in | Wt 255.0 lb

## 2018-06-15 DIAGNOSIS — Z6841 Body Mass Index (BMI) 40.0 and over, adult: Secondary | ICD-10-CM

## 2018-06-15 DIAGNOSIS — R51 Headache: Secondary | ICD-10-CM

## 2018-06-15 DIAGNOSIS — R42 Dizziness and giddiness: Secondary | ICD-10-CM

## 2018-06-15 DIAGNOSIS — R351 Nocturia: Secondary | ICD-10-CM

## 2018-06-15 DIAGNOSIS — R519 Headache, unspecified: Secondary | ICD-10-CM

## 2018-06-15 DIAGNOSIS — G4719 Other hypersomnia: Secondary | ICD-10-CM

## 2018-06-15 DIAGNOSIS — Z820 Family history of epilepsy and other diseases of the nervous system: Secondary | ICD-10-CM | POA: Diagnosis not present

## 2018-06-15 NOTE — Progress Notes (Signed)
Subjective:    Patient ID: Sarah Sloan is a 63 y.o. female.  HPI     Huston Foley, MD, PhD [facility] [address] [address] [place], [place] [address]  Dear Sarah Sloan,   I saw your patient, Sarah Sloan, upon your kind request in my neurologic clinic today for initial consultation of her dizziness. Patient is unaccompanied today. As you know, [name] is a 63 year old right-handed woman with an underlying medical history of polyarthralgia, hypertension, lumbar spinal stenosis, degenerative disc disease of the lumbar spine, and morbid obesity with a BMI of over 40, who reports recurrent dizziness, feeling of poor balance. This has been off and on since 2018. I reviewed your office note from 04/20/2018. Of note, she recently went to the emergency room with headache and was symptomatically treated for migraines. She had a brain MRI without contrast on 02/12/2018 and I reviewed the results:   IMPRESSION: 1. No acute finding. 2. 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    She reports sometimes feeling lightheaded with sudden position changes. She works as an Ecologist, at [employer], in [employer], works from 7:30 to [employer]. She lives with her husband, does not know if she snores, her husband sleeps in a different bedroom, she has woken up with a headache at times.  She has significant nocturia, about 5/night, has had AM HAs, tries to be in bed before 9 PM. She has to be up at 4 AM. She has coffee in AM, smokes  ppd and is trying to quit smoking, drinks alcohol rarely. She hydrates well with water, about 4 bottles of water.  Labs from 02/17/18 were reviewed: CMP was okay, CBC unrem. Chol borderline at 205, LDL slightly up at 121.  She does not sleep well, does not wake up fully rested. Her Epworth sleepiness score is 16 out of 24, fatigue score is 44 out of 63. She admits that she has dozed off at the stoplight before.  Her brother has sleep apnea and has a CPAP machine. Her weight has been fluctuating. She fell 2 times within a few days of each other about 2 months ago she states. One time she missed the chair and the other time she thought there was a chair and she fell backwards. She did not injure herself. She has lower extremity swelling for which she has been using compression socks.   Her Past Medical History Is Significant For: Past Medical History:  Diagnosis Date  . Hypertension   . Rheumatoid arthritis (HCC)     Her Past Surgical History Is Significant For: Past Surgical History:  Procedure Laterality Date  . HAND SURGERY Left    x2  . TONSILLECTOMY     age 12     Her Family History Is Significant For: Family History  Problem Relation Age of Onset  . Rheum arthritis Mother   . Rheum arthritis Sister   . Rheum arthritis Brother   . Breast cancer Maternal Grandmother 18    Her Social History Is Significant For: Social History   Socioeconomic History  . Marital status: Married    Spouse name: Not on file  . Number of children: Not on file  . Years of education: Not on file  . Highest education level: Not on file  Occupational History  . Not on file  Social Needs  . Financial resource strain: Not on file  . Food insecurity:    Worry: Not on  file    Inability: Not on file  . Transportation needs:    Medical: Not on file    Non-medical: Not on file  Tobacco Use  . Smoking status: Current Some Day Smoker    Packs/day: 0.25    Years: 49.00    Pack years: 12.25    Types: Cigarettes  . Smokeless tobacco: Never Used  Substance and Sexual Activity  . Alcohol use: Yes    Comment: occ  . Drug use: Not Currently  . Sexual activity: Not on file  Lifestyle  . Physical activity:    Days per week: Not on file    Minutes per session: Not on file  . Stress: Not on file  Relationships  . Social connections:    Talks on phone: Not on file    Gets together: Not on file    Attends  religious service: Not on file    Active member of club or organization: Not on file    Attends meetings of clubs or organizations: Not on file    Relationship status: Not on file  Other Topics Concern  . Not on file  Social History Narrative  . Not on file    Her Allergies Are:  Allergies  Allergen Reactions  . Penicillins Other (See Comments)    Causes yeast infections Has patient had a PCN reaction causing immediate rash, facial/tongue/throat swelling, SOB or lightheadedness with hypotension: no Has patient had a PCN reaction causing severe rash involving mucus membranes or skin necrosis: no Has patient had a PCN reaction that required hospitalization : no Has patient had a PCN reaction occurring within the last 10 years: yes If all of the above answers are "NO", then may proceed with Cephalosporin use.   :   Her Current Medications Are:  Outpatient Encounter Medications as of 06/15/2018  Medication Sig  . aspirin 81 MG chewable tablet Chew by mouth daily.  . Cholecalciferol (VITAMIN D PO) Take 1 tablet by mouth once a week.   . diclofenac sodium (VOLTAREN) 1 % GEL Apply 3 grams to 3 large joints up to 3 times daily  . GARLIC-PARSLEY PO Take 1 tablet by mouth daily.  . Ginger, Zingiber officinalis, (GINGER PO) Take 1 tablet by mouth daily.  Marland Kitchen. ibuprofen (ADVIL,MOTRIN) 600 MG tablet Take 1 tablet (600 mg total) by mouth every 6 (six) hours as needed. (Patient taking differently: Take 800 mg by mouth every 6 (six) hours as needed. )  . Magnesium 400 MG TABS Take by mouth daily.  . Multiple Vitamins-Minerals (MULTIVITAMIN WITH MINERALS) tablet Take 1 tablet by mouth daily.  Marland Kitchen. olmesartan (BENICAR) 20 MG tablet Take 20 mg by mouth daily.  Marland Kitchen. PROAIR HFA 108 (90 Base) MCG/ACT inhaler Inhale 2 puffs into the lungs every 6 (six) hours as needed.   Marland Kitchen. SAXENDA 18 MG/3ML SOPN Inject 3 mg into the skin daily.  . SYMBICORT 160-4.5 MCG/ACT inhaler INHALE 2 PUFFS BY MOUTH TWICE A DAY ( IN THE  MORNING AND EVENING )  . [DISCONTINUED] SAXENDA 18 MG/3ML SOPN INJECT 3 MG UNDER THE SKIN EVERY DAY IN THE ABDOMEN, THIGH, OR UPPER ARM   No facility-administered encounter medications on file as of 06/15/2018.   :   Review of Systems:  Out of a complete 14 point review of systems, all are reviewed and negative with the exception of these symptoms as listed below:  Review of Systems  Neurological:       Pt presents today to discuss her  dizziness. Pt reports that she may be having mini strokes. Pt reports that she is dizzy all the time. Pt has never had a sleep study but does not endorse snoring.  Epworth Sleepiness Scale 0= would never doze 1= slight chance of dozing 2= moderate chance of dozing 3= high chance of dozing  Sitting and reading: 2 Watching TV: 3 Sitting inactive in a public place (ex. Theater or meeting): 2 As a passenger in a car for an hour without a break: 2 Lying down to rest in the afternoon: 2 Sitting and talking to someone: 2 Sitting quietly after lunch (no alcohol): 2 In a car, while stopped in traffic: 1 Total: 16     Objective:  Neurological Exam  Physical Exam Physical Examination:   On orthostatic testing, she had a lying blood pressure and pulse of 126/79 with pulse of 79, sitting 109/72 with a pulse of 88, standing 122/79 with a pulse of 98. She had no significant symptoms upon changing positions such as vertiginous symptoms or lightheadedness. Feels tired.   General Examination: The patient is a very pleasant 62 y.o. female in no acute distress. She appears well-developed and well-nourished and well groomed.   HEENT: Normocephalic, atraumatic, pupils are equal, round and reactive to light and accommodation. Funduscopic exam is normal with sharp disc margins noted. Extraocular tracking is good without limitation to gaze excursion or nystagmus noted. Normal smooth pursuit is noted. Hearing is grossly intact. Tympanic membranes are clear bilaterally.  Face is symmetric with normal facial animation and normal facial sensation. Speech is clear with no dysarthria noted. There is no hypophonia. There is no lip, neck/head, jaw or voice tremor. Neck is supple with full range of passive and active motion. There are no carotid bruits on auscultation. Oropharynx exam reveals: moderate mouth dryness, nearly edentulous state and moderate airway crowding secondary to wider tongue and redundant soft palate. Uvula was not fully visualized, Mallampati class III, tonsils are absent. Neck circumference was 14-1/2 inches.  Chest: Clear to auscultation without wheezing, rhonchi or crackles noted.  Heart: S1+S2+0, regular and normal without murmurs, rubs or gallops noted.   Abdomen: Soft, non-tender and non-distended with normal bowel sounds appreciated on auscultation.  Extremities: There is 1+ pitting edema in the distal lower extremities bilaterally, Right side more than left, compression socks up to knees bilaterally.  Skin: Warm and dry without trophic changes noted.  Musculoskeletal: exam reveals right sided low back pain and she reports left knee pain.   Neurologically:  Mental status: The patient is awake, alert and oriented in all 4 spheres. Her immediate and remote memory, attention, language skills and fund of knowledge are appropriate. There is no evidence of aphasia, agnosia, apraxia or anomia. Speech is clear with normal prosody and enunciation. Thought process is linear. Mood is normal and affect is normal.  Cranial nerves II - XII are as described above under HEENT exam. In addition: shoulder shrug is normal with equal shoulder height noted. Motor exam: Normal bulk, strength and tone is noted. There is no drift, tremor or rebound. Romberg is negative. Reflexes are 1+ in the upper extremities, trace in the lower extremities. Fine motor skills and coordination: She has nonspecific slowness with her finger taps, hand movements and rapid alternating  patting, otherwise no significant findings. She has no postural or action tremor. Cerebellar testing: No dysmetria or intention tremor on finger to nose testing. Heel to shin is difficult bilaterally. Sensory exam: intact to light touch in the upper and  lower extremities.  Gait, station and balance: She stands with difficulty. He stands slightly wide-based. She may have genua valgi.  She walks slowly with a slight limp on the left. She has preserved arm swing. No walking aid.   Assessment and Plan:  In summary, Latanya MaudlinWendy C Vanleer is a very pleasant 63 y.o.-year old female with an underlying medical history of polyarthralgia, hypertension, lumbar spinal stenosis, degenerative disc disease of the lumbar spine, and morbid obesity with a BMI of over 40, who presents for evaluation of her dizziness. On examination she has a nonfocal neurological exam, no evidence of one-sided symptoms, no vertiginous symptoms, no obvious orthostatic hypotension. She had a brain MRI without contrast about 3 months ago. She was advised of the test results and strongly advised to quit smoking as this could be a risk factor for vascular disease , in addition she is encouraged to continue to work on weight loss. She may be at risk for sleep apnea given her history of significant daytime somnolence, significant nighttime sleep disruption from nocturia and history of morning headaches. She is advised to proceed with a sleep study. Reports a family history of OSA. I had a long chat with the patient about my findings and the diagnosis of OSA, its prognosis and treatment options. We talked about medical treatments, surgical interventions and non-pharmacological approaches. I explained in particular the risks and ramifications of untreated moderate to severe OSA, especially with respect to developing cardiovascular disease down the Road, including congestive heart failure, difficult to treat hypertension, cardiac arrhythmias, or stroke. Even  type 2 diabetes has, in part, been linked to untreated OSA. Symptoms of untreated OSA include daytime sleepiness, memory problems, mood irritability and mood disorder such as depression and anxiety, lack of energy, as well as recurrent headaches, especially morning headaches. We talked about smoking cessation and trying to maintain a healthy lifestyle in general, as well as the importance of weight control. I encouraged the patient to eat healthy, exercise daily and keep well hydrated, to keep a scheduled bedtime and wake time routine, to not skip any meals and eat healthy snacks in between meals. I advised the patient not to drive when feeling sleepy. I recommended the following at this time: sleep study with potential positive airway pressure titration. (We will score hypopneas at 3%).   I explained the sleep test procedure to the patient and also outlined possible surgical and non-surgical treatment options of OSA, including the use of a custom-made dental device (which would require a referral to a specialist dentist or oral surgeon), upper airway surgical options, such as pillar implants, radiofrequency surgery, tongue base surgery, and UPPP (which would involve a referral to an ENT surgeon). Rarely, jaw surgery such as mandibular advancement may be considered.  I also explained the CPAP treatment option to the patient, who indicated that she would be willing to try CPAP if the need arises. I explained the importance of being compliant with PAP treatment, not only for insurance purposes but primarily to improve Her symptoms, and for the patient's long term health benefit, including to reduce Her cardiovascular risks. I answered all her questions today and the patient was in agreement. I plan to see her back back after the sleep study is completed and encouraged her to call with any interim questions, concerns, problems or updates.   Thank you very much for allowing me to participate in the care of this  nice patient. If I can be of any further assistance to you  please do not hesitate to call me at [phone].  Sincerely,   Star Age, MD, PhD

## 2018-06-15 NOTE — Patient Instructions (Signed)
Unfortunately, dizziness is a very common complaint but is often not due to a primary neurological reason or single underlying medical problem. Often, there a combination of factors, that result in dizziness. This includes blood pressure fluctuations, medication side effects, blood sugar fluctuations, stress, vertigo, poor sleep with sleep deprivation, dehydration, and electrolyte disturbance or other metabolic and endocrinological reasons, meaning hormone related problems such as thyroid dysfunction.  We will investigate things further with a sleep study to rule out sleep apnea.   Based on your symptoms and your exam I believe you are at risk for obstructive sleep apnea (aka OSA), and I think we should proceed with a sleep study to determine whether you do or do not have OSA and how severe it is. Even, if you have mild OSA, I may want you to consider treatment with CPAP, as treatment of even borderline or mild sleep apnea can result and improvement of symptoms such as sleep disruption, daytime sleepiness, nighttime bathroom breaks, restless leg symptoms, improvement of headache syndromes, even improved mood disorder.   Please remember, the long-term risks and ramifications of untreated moderate to severe obstructive sleep apnea are: increased Cardiovascular disease, including congestive heart failure, stroke, difficult to control hypertension, treatment resistant obesity, arrhythmias, especially irregular heartbeat commonly known as A. Fib. (atrial fibrillation); even type 2 diabetes has been linked to untreated OSA.   Sleep apnea can cause disruption of sleep and sleep deprivation in most cases, which, in turn, can cause recurrent headaches, problems with memory, mood, concentration, focus, and vigilance. Most people with untreated sleep apnea report excessive daytime sleepiness, which can affect their ability to drive. Please do not drive if you feel sleepy. Patients with sleep apnea developed difficulty  initiating and maintaining sleep (aka insomnia).   Having sleep apnea may increase your risk for other sleep disorders, including involuntary behaviors sleep such as sleep terrors, sleep talking, sleepwalking.    Having sleep apnea can also increase your risk for restless leg syndrome and leg movements at night.   Please note that untreated obstructive sleep apnea may carry additional perioperative morbidity. Patients with significant obstructive sleep apnea (typically, in the moderate to severe degree) should receive, if possible, perioperative PAP (positive airway pressure) therapy and the surgeons and particularly the anesthesiologists should be informed of the diagnosis and the severity of the sleep disordered breathing.   I will likely see you back after your sleep study to go over the test results and where to go from there. We will call you after your sleep study to advise about the results (most likely, you will hear from [place], my nurse) and to set up an appointment at the time, as necessary.    Our sleep lab administrative assistant will call you to schedule your sleep study and give you further instructions, regarding the check in process for the sleep study, arrival time, what to bring, when you can expect to leave after the study, etc., and to answer any other logistical questions you may have. If you don't hear back from her by about 2 weeks from now, please feel free to call her direct line at [phone] or you can call our general clinic number, or email [place] through My Chart.

## 2018-06-16 ENCOUNTER — Other Ambulatory Visit: Payer: Self-pay | Admitting: Nurse Practitioner

## 2018-06-16 ENCOUNTER — Telehealth (INDEPENDENT_AMBULATORY_CARE_PROVIDER_SITE_OTHER): Payer: Self-pay

## 2018-06-16 NOTE — Telephone Encounter (Signed)
Submitted VOB for Synvisc series, left knee. 

## 2018-07-01 ENCOUNTER — Encounter: Payer: Self-pay | Admitting: Nurse Practitioner

## 2018-07-01 ENCOUNTER — Ambulatory Visit: Payer: BC Managed Care – PPO | Admitting: Nurse Practitioner

## 2018-07-01 VITALS — BP 120/68 | HR 85 | Temp 98.3°F | Ht 66.0 in | Wt 257.4 lb

## 2018-07-01 DIAGNOSIS — J0110 Acute frontal sinusitis, unspecified: Secondary | ICD-10-CM

## 2018-07-01 DIAGNOSIS — Z6841 Body Mass Index (BMI) 40.0 and over, adult: Secondary | ICD-10-CM | POA: Diagnosis not present

## 2018-07-01 DIAGNOSIS — K029 Dental caries, unspecified: Secondary | ICD-10-CM

## 2018-07-01 MED ORDER — LIRAGLUTIDE -WEIGHT MANAGEMENT 18 MG/3ML ~~LOC~~ SOPN: 3.0000 mg | PEN_INJECTOR | Freq: Every day | SUBCUTANEOUS | 1 refills | Status: DC

## 2018-07-01 MED ORDER — DOXYCYCLINE HYCLATE 100 MG PO TABS: 100.0000 mg | ORAL_TABLET | Freq: Two times a day (BID) | ORAL | 0 refills | Status: AC

## 2018-07-01 NOTE — Patient Instructions (Signed)

## 2018-07-01 NOTE — Progress Notes (Signed)
Subjective:     Patient ID: Sarah Sloan , female    DOB: [birth year] , 63 y.o.   MRN: [id number]   Chief Complaint  Patient presents with  . Obesity  . URI    patient states she has sinus pressure and postnasal drip causing her gums to hurt. pt also reports she is having some ear pain.    HPI  Obesity - continues to take Saxenda 3 mg daily tolerating well.  No exercise.  She is trying to change from eating fast food, bread and cookies.    Last Weight  Most recent update: 07/01/2018  3:13 PM   Weight  116.8 kg (257 lb 6.4 oz)           URI   This is a new problem. The current episode started 1 to 4 weeks ago. The problem has been unchanged. There has been no fever. Pertinent negatives include no abdominal pain, congestion, coughing, headaches, sore throat or swollen glands. She has tried steam for the symptoms. The treatment provided no relief.     Past Medical History:  Diagnosis Date  . Hypertension   . Rheumatoid arthritis (HCC)      Family History  Problem Relation Age of Onset  . Rheum arthritis Mother   . Rheum arthritis Sister   . Rheum arthritis Brother   . Breast cancer Maternal Grandmother 79     Current Outpatient Medications:  .  aspirin 81 MG chewable tablet, Chew by mouth daily., Disp: , Rfl:  .  Cholecalciferol (VITAMIN D PO), Take 1 tablet by mouth once a week. , Disp: , Rfl:  .  diclofenac sodium (VOLTAREN) 1 % GEL, Apply 3 grams to 3 large joints up to 3 times daily, Disp: 3 Tube, Rfl: 3 .  GARLIC-PARSLEY PO, Take 1 tablet by mouth daily., Disp: , Rfl:  .  ibuprofen (ADVIL,MOTRIN) 600 MG tablet, Take 1 tablet (600 mg total) by mouth every 6 (six) hours as needed. (Patient taking differently: Take 800 mg by mouth every 6 (six) hours as needed. ), Disp: 30 tablet, Rfl: 0 .  Multiple Vitamins-Minerals (MULTIVITAMIN WITH MINERALS) tablet, Take 1 tablet by mouth daily., Disp: , Rfl:  .  olmesartan (BENICAR) 20 MG tablet, TAKE ONE TABLET BY MOUTH ONCE  DAILY, Disp: 90 tablet, Rfl: 0 .  PROAIR HFA 108 (90 Base) MCG/ACT inhaler, Inhale 2 puffs into the lungs every 6 (six) hours as needed. , Disp: , Rfl: 1 .  SAXENDA 18 MG/3ML SOPN, INJECT 3 MG UNDER THE SKIN EVERY DAY IN THE ABDOMEN, THIGH, OR UPPER ARM, Disp: 5 pen, Rfl: 0 .  SYMBICORT 160-4.5 MCG/ACT inhaler, INHALE 2 PUFFS BY MOUTH TWICE A DAY ( IN THE MORNING AND EVENING ), Disp: 3 Inhaler, Rfl: 1   Allergies  Allergen Reactions  . Penicillins Other (See Comments)    Causes yeast infections Has patient had a PCN reaction causing immediate rash, facial/tongue/throat swelling, SOB or lightheadedness with hypotension: no Has patient had a PCN reaction causing severe rash involving mucus membranes or skin necrosis: no Has patient had a PCN reaction that required hospitalization : no Has patient had a PCN reaction occurring within the last 10 years: yes If all of the above answers are "NO", then may proceed with Cephalosporin use.      Review of Systems  Constitutional: Negative for diaphoresis and fatigue.  HENT: Positive for sinus pressure. Negative for congestion and sore throat.   Respiratory: Negative.  Negative for  cough.   Cardiovascular: Negative.   Gastrointestinal: Negative for abdominal pain.  Endocrine: Negative for polydipsia, polyphagia and polyuria.  Neurological: Negative for dizziness and headaches.  Psychiatric/Behavioral: Negative.      Today's Vitals   07/01/18 1508  BP: 120/68  Pulse: 85  Temp: 98.3 F (36.8 C)  TempSrc: Oral  SpO2: 96%  Weight: 257 lb 6.4 oz (116.8 kg)  Height: 5\' 6"  (1.676 m)  PainSc: 6    Body mass index is 41.55 kg/m.   Objective:  Physical Exam Vitals signs reviewed.  Constitutional:      Appearance: Normal appearance.  HENT:     Head: Normocephalic and atraumatic.     Right Ear: Tympanic membrane, ear canal and external ear normal. There is no impacted cerumen.     Left Ear: Tympanic membrane, ear canal and external ear  normal. There is no impacted cerumen.     Nose: Nose normal. No congestion.     Mouth/Throat:     Mouth: Mucous membranes are moist.  Eyes:     Extraocular Movements: Extraocular movements intact.     Conjunctiva/sclera: Conjunctivae normal.     Pupils: Pupils are equal, round, and reactive to light.  Cardiovascular:     Rate and Rhythm: Normal rate and regular rhythm.     Pulses: Normal pulses.     Heart sounds: Normal heart sounds. No murmur.  Pulmonary:     Effort: Pulmonary effort is normal.     Breath sounds: Normal breath sounds.  Neurological:     Mental Status: She is alert.          Assessment And Plan:     1. Acute non-recurrent frontal sinusitis  Tender sinuses will treat with doxycycline this has been ongoing for 3 months.  I have also encouraged her to go to the dentist for her dental caries.   - doxycycline (VIBRA-TABS) 100 MG tablet; Take 1 tablet (100 mg total) by mouth 2 (two) times daily.  Dispense: 14 tablet; Refill: 0  2. Class 3 severe obesity due to excess calories without serious comorbidity with body mass index (BMI) of 40.0 to 44.9 in adult [facility]) Chronic She has lost 8 lbs since her last office visit in November 2019 Discussed healthy diet and regular exercise options, I have encouraged her to make sure she limits her intake of sugary foods and drinks, low carbohydrate diet. Encouraged to exercise at least 150 minutes per week with 2 days of strength training Continue Saxenda at 3 mg daily Return in 2 months for weight check. - Liraglutide -Weight Management (SAXENDA) 18 MG/3ML SOPN; Inject 3 mg into the skin daily.  Dispense: 5 pen; Refill: 1        Arnette Felts, FNP

## 2018-07-05 ENCOUNTER — Ambulatory Visit (INDEPENDENT_AMBULATORY_CARE_PROVIDER_SITE_OTHER): Payer: BC Managed Care – PPO | Admitting: Neurology

## 2018-07-05 DIAGNOSIS — Z6841 Body Mass Index (BMI) 40.0 and over, adult: Secondary | ICD-10-CM

## 2018-07-05 DIAGNOSIS — G4720 Circadian rhythm sleep disorder, unspecified type: Secondary | ICD-10-CM

## 2018-07-05 DIAGNOSIS — G4710 Hypersomnia, unspecified: Secondary | ICD-10-CM | POA: Diagnosis not present

## 2018-07-05 DIAGNOSIS — R51 Headache: Secondary | ICD-10-CM

## 2018-07-05 DIAGNOSIS — R519 Headache, unspecified: Secondary | ICD-10-CM

## 2018-07-05 DIAGNOSIS — Z820 Family history of epilepsy and other diseases of the nervous system: Secondary | ICD-10-CM

## 2018-07-05 DIAGNOSIS — G4719 Other hypersomnia: Secondary | ICD-10-CM

## 2018-07-05 DIAGNOSIS — R0683 Snoring: Secondary | ICD-10-CM

## 2018-07-05 DIAGNOSIS — R42 Dizziness and giddiness: Secondary | ICD-10-CM

## 2018-07-05 DIAGNOSIS — R351 Nocturia: Secondary | ICD-10-CM

## 2018-07-06 ENCOUNTER — Telehealth: Payer: Self-pay

## 2018-07-06 NOTE — Progress Notes (Signed)
Patient referred by Arnette Felts, NP, for dizziness, seen by me on 06/15/18, diagnostic PSG on 07/05/18.   Please call and notify the patient that the recent sleep study did not show any significant obstructive sleep apnea with the exception of snoring and REM related OSA; overall AHI was less than 5/hour and no significant desaturations were noted. CPAP therapy is not warranted; weight loss and avoidance of the supine sleep position will likely aid in reducing her snoring and REM related sleep apnea.  At this juncture, given her benign neurological exam, her benign MRI brain and benign sleep study, she can FU with primary care.   Thanks,  Huston Foley, MD, PhD [facility])

## 2018-07-06 NOTE — Telephone Encounter (Signed)
-----   Message from Huston Foley, MD sent at 07/06/2018  8:18 AM EST ----- Patient referred by Arnette Felts, NP, for dizziness, seen by me on 06/15/18, diagnostic PSG on 07/05/18.   Please call and notify the patient that the recent sleep study did not show any significant obstructive sleep apnea with the exception of snoring and REM related OSA; overall AHI was less than 5/hour and no significant desaturations were noted. CPAP therapy is not warranted; weight loss and avoidance of the supine sleep position will likely aid in reducing her snoring and REM related sleep apnea.  At this juncture, given her benign neurological exam, her benign MRI brain and benign sleep study, she can FU with primary care.   Thanks,  Huston Foley, MD, PhD [facility])

## 2018-07-06 NOTE — Telephone Encounter (Signed)
I called pt and discussed her sleep study results with her. She will avoid supine sleep and will follow up with her PCP. Pt verbalized understanding of results. Pt had no questions at this time but was encouraged to call back if questions arise.

## 2018-07-06 NOTE — Procedures (Signed)
PATIENT'S NAME:  Sarah Sloan, Sarah Sloan DOB:      [birth year]      MR#:    [id number]     DATE OF RECORDING: 07/05/2018 REFERRING M.D.:  Arnette Felts, FNP Study Performed:   Baseline Polysomnogram HISTORY:  63 year old right-handed woman with an underlying medical history of polyarthralgia, hypertension, lumbar spinal stenosis, degenerative disc disease of the lumbar spine, and morbid obesity with a BMI of over 40, who reports recurrent dizziness, feeling of poor balance, sleep disruption and daytime sleepiness. The patient endorsed the Epworth Sleepiness Scale at 16 points. The patient's weight 255 pounds with a height of 66 (inches), resulting in a BMI of 41.1 kg/m2. The patient's neck circumference measured 14.5 inches.  CURRENT MEDICATIONS: Aspirin, Voltaren, Advil, Benicar, Proair, Symbicort   PROCEDURE:  This is a multichannel digital polysomnogram utilizing the Somnostar 11.2 system.  Electrodes and sensors were applied and monitored per AASM Specifications.   EEG, EOG, Chin and Limb EMG, were sampled at 200 Hz.  ECG, Snore and Nasal Pressure, Thermal Airflow, Respiratory Effort, CPAP Flow and Pressure, Oximetry was sampled at 50 Hz. Digital video and audio were recorded.      BASELINE STUDY  Lights Out was at 22:26 and Lights On at 04:59.  Total recording time (TRT) was 393.5 minutes, with a total sleep time (TST) of 310 minutes.   The patient's sleep latency was 78 minutes, which is delayed. REM latency was 169.5 minutes, which is delayed. The sleep efficiency was 78.8%.     SLEEP ARCHITECTURE: WASO (Wake after sleep onset) was 42.5 minutes with mild to moderate sleep fragmentation noted. There were 36 minutes in Stage N1, 86.5 minutes Stage N2, 168.5 minutes Stage N3 and 19 minutes in Stage REM.  The percentage of Stage N1 was 11.6%, which is increased, Stage N2 was 27.9%, Stage N3 was 54.4%, which is increased, and Stage R (REM sleep) was 6.1%, which is reduced. The arousals were noted as: 45 were  spontaneous, 0 were associated with PLMs, 8 were associated with respiratory events.  RESPIRATORY ANALYSIS:  There were a total of 18 respiratory events:  1 obstructive apneas, 4 central apneas and 1 mixed apneas with a total of 6 apneas and an apnea index (AI) of 1.2 /hour. There were 12 hypopneas with a hypopnea index of 2.3 /hour. The patient also had 4 respiratory event related arousals (RERAs).      The total APNEA/HYPOPNEA INDEX (AHI) was 3.5 /hour and the total RESPIRATORY DISTURBANCE INDEX was 4.3 /hour.  8 events occurred in REM sleep and 14 events in NREM. The REM AHI was 25.3 /hour, versus a non-REM AHI of 2.1. The patient spent 69 minutes of total sleep time in the supine position and 241 minutes in non-supine.. The supine AHI was 2.6 versus a non-supine AHI of 3.7.  OXYGEN SATURATION & C02:  The Wake baseline 02 saturation was 97%, with the lowest being 88%. Time spent below 89% saturation equaled 1 minutes.  PERIODIC LIMB MOVEMENTS: The patient had a total of 5 Periodic Limb Movements.  The Periodic Limb Movement (PLM) index was 1. and the PLM Arousal index was 0/hour.  Audio and video analysis did not show any abnormal or unusual movements, behaviors, phonations or vocalizations. The patient took no bathroom breaks. Moderate snoring was noted. The EKG was in keeping with normal sinus rhythm (NSR).  Post-study, the patient indicated that sleep was better than usual.   IMPRESSION:  1. Primary snoring 2. Dysfunctions associated with sleep stages  or arousal from sleep  RECOMMENDATIONS:  1. This study does not demonstrate any significant obstructive or central sleep disordered breathing with the exception of snoring and REM related OSA; overall AHI was less than 5/hour and no significant desaturations were noted. CPAP therapy is not warranted; weight loss and avoidance of the supine sleep position will likely aid in reducing her snoring and REM related sleep apnea. This study does not  support an intrinsic sleep disorder as a cause of the patient's symptoms. Other causes, including circadian rhythm disturbances, an underlying mood disorder, medication effect and/or an underlying medical problem cannot be ruled out. 2. This study shows sleep fragmentation and abnormal sleep stage percentages; these are nonspecific findings and per se do not signify an intrinsic sleep disorder or a cause for the patient's sleep-related symptoms. Causes include (but are not limited to) the first night effect of the sleep study, circadian rhythm disturbances, medication effect or an underlying mood disorder or medical problem.  3. The patient should be cautioned not to drive, work at heights, or operate dangerous or heavy equipment when tired or sleepy. Review and reiteration of good sleep hygiene measures should be pursued with any patient. 4. The patient will be advised to follow up with the referring provider, who will be notified of the test results.  I certify that I have reviewed the entire raw data recording prior to the issuance of this report in accordance with the Standards of Accreditation of the American Academy of Sleep Medicine (AASM)     Huston Foley, MD, PhD Diplomat, American Board of Neurology and Sleep Medicine (Neurology and Sleep Medicine)

## 2018-07-28 NOTE — Progress Notes (Deleted)
Office Visit Note  Patient: Sarah Sloan             Date of Birth: 1956/01/23           MRN: [id number]             PCP: Arnette Felts, FNP Referring: Arnette Felts, FNP Visit Date: 08/11/2018 Occupation: @GUAROCC @  Subjective:  No chief complaint on file.   History of Present Illness: Sarah Sloan is a 63 y.o. female ***   Activities of Daily Living:  Patient reports morning stiffness for *** {minute/hour:19697}.   Patient {ACTIONS;DENIES/REPORTS:21021675::"Denies"} nocturnal pain.  Difficulty dressing/grooming: {ACTIONS;DENIES/REPORTS:21021675::"Denies"} Difficulty climbing stairs: {ACTIONS;DENIES/REPORTS:21021675::"Denies"} Difficulty getting out of chair: {ACTIONS;DENIES/REPORTS:21021675::"Denies"} Difficulty using hands for taps, buttons, cutlery, and/or writing: {ACTIONS;DENIES/REPORTS:21021675::"Denies"}  No Rheumatology ROS completed.   PMFS History:  Patient Active Problem List   Diagnosis Date Noted  . Acute non-recurrent frontal sinusitis 07/01/2018  . Primary osteoarthritis of both hips 05/03/2018  . Primary osteoarthritis of both knees 05/03/2018  . Essential hypertension 01/28/2018  . Carpal tunnel syndrome, left upper limb 01/28/2018  . Tobacco use 01/28/2018  . Primary osteoarthritis of both hands 01/28/2018  . DDD (degenerative disc disease), lumbar 01/28/2018    Past Medical History:  Diagnosis Date  . Hypertension   . Rheumatoid arthritis (HCC)     Family History  Problem Relation Age of Onset  . Rheum arthritis Mother   . Rheum arthritis Sister   . Rheum arthritis Brother   . Breast cancer Maternal Grandmother 75   Past Surgical History:  Procedure Laterality Date  . HAND SURGERY Left    x2  . TONSILLECTOMY     age 42    Social History   Social History Narrative  . Not on file   Immunization History  Administered Date(s) Administered  . Influenza-Unspecified 02/23/2018     Objective: Vital Signs: There were no vitals taken  for this visit.   Physical Exam   Musculoskeletal Exam: ***  CDAI Exam: CDAI Score: Not documented Patient Global Assessment: Not documented; Provider Global Assessment: Not documented Swollen: Not documented; Tender: Not documented Joint Exam   Not documented   There is currently no information documented on the homunculus. Go to the Rheumatology activity and complete the homunculus joint exam.  Investigation: No additional findings.  Imaging: No results found.  Recent Labs: Lab Results  Component Value Date   WBC 5.3 02/12/2018   HGB 13.9 02/12/2018   PLT 310 02/12/2018   NA 140 02/12/2018   K 3.6 02/12/2018   CL 104 02/12/2018   CO2 24 02/12/2018   GLUCOSE 91 02/12/2018   BUN 11 02/12/2018   CREATININE 0.80 02/12/2018   BILITOT 0.7 02/12/2018   ALKPHOS 84 02/12/2018   AST 18 02/12/2018   ALT 16 02/12/2018   PROT 7.5 02/12/2018   ALBUMIN 3.9 02/12/2018   CALCIUM 9.4 02/12/2018   GFRAA >60 02/12/2018    Speciality Comments: No specialty comments available.  Procedures:  No procedures performed Allergies: Penicillins   Assessment / Plan:     Visit Diagnoses: No diagnosis found.   Orders: No orders of the defined types were placed in this encounter.  No orders of the defined types were placed in this encounter.   Face-to-face time spent with patient was *** minutes. Greater than 50% of time was spent in counseling and coordination of care.  Follow-Up Instructions: No follow-ups on file.   Ellen Henri, CMA  Note - This record has been  created using [employer].  Chart creation errors have been sought, but may not always  have been located. Such creation errors do not reflect on  the standard of medical care.

## 2018-08-10 ENCOUNTER — Encounter (INDEPENDENT_AMBULATORY_CARE_PROVIDER_SITE_OTHER): Payer: Self-pay | Admitting: Radiology

## 2018-08-10 NOTE — Progress Notes (Unsigned)
Please call patient and tell her insurance has authorized injections, auth # [id number], valid 08/08/18 through 08/08/19, 96 units, but this was for left knee. She will have auth valid through that date for another injection in 6 months if she wants.  Sarah Sloan patient.  $49 copay and insurance pays the rest.  Thanks.

## 2018-08-11 ENCOUNTER — Ambulatory Visit: Payer: BC Managed Care – PPO | Admitting: Rheumatology

## 2018-08-15 ENCOUNTER — Telehealth: Payer: Self-pay | Admitting: Rheumatology

## 2018-08-15 NOTE — Telephone Encounter (Signed)
LMOM for patient to call and schedule gel injections.

## 2018-08-15 NOTE — Progress Notes (Unsigned)
LMOM for patient to call and schedule injections. °

## 2018-08-31 ENCOUNTER — Other Ambulatory Visit: Payer: Self-pay

## 2018-08-31 ENCOUNTER — Ambulatory Visit: Payer: BC Managed Care – PPO | Admitting: Nurse Practitioner

## 2018-08-31 ENCOUNTER — Encounter: Payer: Self-pay | Admitting: Nurse Practitioner

## 2018-08-31 VITALS — BP 122/70 | HR 83 | Temp 98.2°F | Ht 66.0 in | Wt 256.2 lb

## 2018-08-31 DIAGNOSIS — G4700 Insomnia, unspecified: Secondary | ICD-10-CM | POA: Insufficient documentation

## 2018-08-31 DIAGNOSIS — Z6841 Body Mass Index (BMI) 40.0 and over, adult: Secondary | ICD-10-CM

## 2018-08-31 MED ORDER — TRAZODONE HCL 50 MG PO TABS: 50.0000 mg | ORAL_TABLET | Freq: Every day | ORAL | 1 refills | Status: DC

## 2018-08-31 MED ORDER — LIRAGLUTIDE -WEIGHT MANAGEMENT 18 MG/3ML ~~LOC~~ SOPN: 3.0000 mg | PEN_INJECTOR | Freq: Every day | SUBCUTANEOUS | 1 refills | Status: AC

## 2018-08-31 NOTE — Patient Instructions (Signed)
   Walk 15 minutes per day   Decrease bread intake to 3 times per week

## 2018-08-31 NOTE — Progress Notes (Signed)
Subjective:     Patient ID: Sarah Sloan , female    DOB: 20-Oct-1955 , 63 y.o.   MRN: [id number]   Chief Complaint  Patient presents with  . Obesity    saxenda    HPI  Obesity - taking Saxenda daily 3mg .  She feels like she has gained weight.   She is currently on Administrative Leave - [employer] since March 20th.  Since being out of work she has been cleaning up, family affairs.     Insomnia  Primary symptoms: fragmented sleep, no sleep disturbance, frequent awakening.  The current episode started more than one year. The onset quality is gradual. The problem occurs nightly. The problem has been gradually worsening since onset. How many beverages per day that contain caffeine: 0 - 1.  Types of beverages you drink: coffee. The treatment provided no relief. PMH includes: associated symptoms present, no hypertension, no depression, no chronic pain. Prior diagnostic workup includes:  Polysomnogram.     Past Medical History:  Diagnosis Date  . Hypertension   . Rheumatoid arthritis (HCC)      Family History  Problem Relation Age of Onset  . Rheum arthritis Mother   . Rheum arthritis Sister   . Rheum arthritis Brother   . Breast cancer Maternal Grandmother [phone]     Current Outpatient Medications:  .  aspirin 81 MG chewable tablet, Chew by mouth daily., Disp: , Rfl:  .  diclofenac sodium (VOLTAREN) 1 % GEL, Apply 3 grams to 3 large joints up to 3 times daily, Disp: 3 Tube, Rfl: 3 .  Liraglutide -Weight Management (SAXENDA) 18 MG/3ML SOPN, Inject 3 mg into the skin daily., Disp: 5 pen, Rfl: 1 .  Multiple Vitamins-Minerals (MULTIVITAMIN WITH MINERALS) tablet, Take 1 tablet by mouth daily., Disp: , Rfl:  .  olmesartan (BENICAR) 20 MG tablet, TAKE ONE TABLET BY MOUTH ONCE DAILY, Disp: 90 tablet, Rfl: 0 .  PROAIR HFA 108 (90 Base) MCG/ACT inhaler, Inhale 2 puffs into the lungs every 6 (six) hours as needed. , Disp: , Rfl: 1 .  SYMBICORT 160-4.5 MCG/ACT inhaler, INHALE 2 PUFFS  BY MOUTH TWICE A DAY ( IN THE MORNING AND EVENING ), Disp: 3 Inhaler, Rfl: 1 .  Cholecalciferol (VITAMIN D PO), Take 1 tablet by mouth once a week. , Disp: , Rfl:  .  doxycycline (VIBRA-TABS) 100 MG tablet, Take 1 tablet (100 mg total) by mouth 2 (two) times daily. (Patient not taking: Reported on 08/31/2018), Disp: 14 tablet, Rfl: 0 .  GARLIC-PARSLEY PO, Take 1 tablet by mouth daily., Disp: , Rfl:  .  ibuprofen (ADVIL,MOTRIN) 600 MG tablet, Take 1 tablet (600 mg total) by mouth every 6 (six) hours as needed. (Patient not taking: Reported on 08/31/2018), Disp: 30 tablet, Rfl: 0   Allergies  Allergen Reactions  . Penicillins Other (See Comments)    Causes yeast infections Has patient had a PCN reaction causing immediate rash, facial/tongue/throat swelling, SOB or lightheadedness with hypotension: no Has patient had a PCN reaction causing severe rash involving mucus membranes or skin necrosis: no Has patient had a PCN reaction that required hospitalization : no Has patient had a PCN reaction occurring within the last 10 years: yes If all of the above answers are "NO", then may proceed with Cephalosporin use.      Review of Systems  Constitutional: Positive for fatigue. Negative for fever.  Eyes: Negative for photophobia.  Respiratory: Negative for cough.   Cardiovascular: Negative.   Skin:  Negative.   Neurological: Negative for dizziness, syncope and headaches.  Psychiatric/Behavioral: Negative for confusion, depression and sleep disturbance. The patient has insomnia.      Today's Vitals   08/31/18 1501  BP: 122/70  Pulse: 83  Temp: 98.2 F (36.8 C)  TempSrc: Oral  SpO2: 98%  Weight: 256 lb 3.2 oz (116.2 kg)  Height: 5\' 6"  (1.676 m)   Body mass index is 41.35 kg/m.   Objective:  Physical Exam Constitutional:      Appearance: Normal appearance.  Cardiovascular:     Rate and Rhythm: Normal rate and regular rhythm.     Pulses: Normal pulses.     Heart sounds: Normal heart  sounds. No murmur.  Pulmonary:     Effort: Pulmonary effort is normal.     Breath sounds: Normal breath sounds.  Neurological:     Mental Status: She is alert.         Assessment And Plan:     1. Class 3 severe obesity due to excess calories without serious comorbidity with body mass index (BMI) of 40.0 to 44.9 in adult [facility])  Chronic  Discussed healthy diet and regular exercise options   Encouraged to exercise at least 150 minutes per week with 2 days of strength training  Return in 2 months for weight check.   2. Insomnia, unspecified type  She  Is in a situation at home that she feels unsafe, I discussed with her getting her help to a safe place and she was resistant    I have advised her if needed to contact 911  I also offered to send her to counseling but she declined.  Arnette Felts, FNP    THE PATIENT IS ENCOURAGED TO PRACTICE SOCIAL DISTANCING DUE TO THE COVID-19 PANDEMIC.

## 2018-09-05 ENCOUNTER — Other Ambulatory Visit: Payer: Self-pay | Admitting: Nurse Practitioner

## 2018-09-22 ENCOUNTER — Other Ambulatory Visit: Payer: Self-pay | Admitting: Nurse Practitioner

## 2018-09-26 ENCOUNTER — Other Ambulatory Visit: Payer: Self-pay | Admitting: Nurse Practitioner

## 2018-11-02 ENCOUNTER — Ambulatory Visit: Payer: BC Managed Care – PPO | Admitting: Nurse Practitioner

## 2018-12-22 ENCOUNTER — Other Ambulatory Visit: Payer: Self-pay | Admitting: Nurse Practitioner

## 2019-02-21 ENCOUNTER — Telehealth: Payer: Self-pay

## 2019-02-21 NOTE — Telephone Encounter (Signed)
Patient returned my call and declined to schedule her 2 month f/u due to her not having insurance I told her we received a letter for a PA for saxenda and she stated she stopped taking the medication a while ago. YRL,RMA

## 2019-02-21 NOTE — Telephone Encounter (Signed)
We have received a letter from patient's pharmacy stating that patient's saxenda needs a prior authorization but patient has not been here since April so I have called pt and left her a vm to call the office so we can schedule her an office visit she was supposed to come back in 2 months for a weight check. YRL,RMA

## 2019-02-22 ENCOUNTER — Encounter: Payer: BC Managed Care – PPO | Admitting: Nurse Practitioner

## 2019-09-19 IMAGING — MR MR HEAD W/O CM
10 of 11 series · 43 of 48 positions shown · non-contrast
Comparison: Head CT earlier today

CLINICAL DATA: Focal neuro deficit with stroke suspected

EXAM:
MRI HEAD WITHOUT CONTRAST
TECHNIQUE: Multiplanar, multiecho pulse sequences of the brain and surrounding
structures were obtained without intravenous contrast.

[Series 5: ax dwi_tracew · axial · 3.0mm · 1.50mm/px · z∈[-70,+73]mm · 10 of 100 slices shown]
[im 1/100]
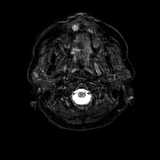
[im 12/100]
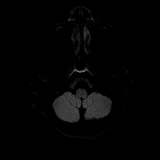
[im 23/100]
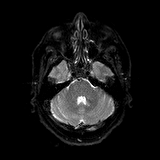
[im 34/100]
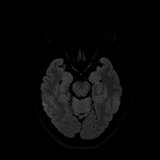
[im 45/100]
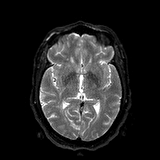
[im 56/100]
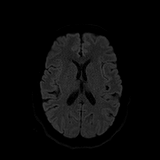
[im 67/100]
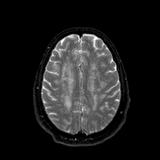
[im 78/100]
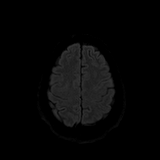
[im 89/100]
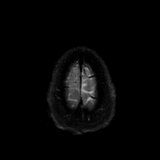
[im 100/100]
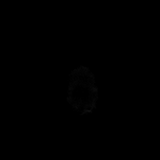

[Series 6: ax dwi_adc · axial · 3.0mm · 1.50mm/px · z∈[-70,+73]mm · 5 of 49 slices shown]
[im 1/49]
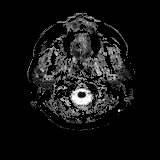
[im 13/49]
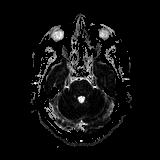
[im 25/49]
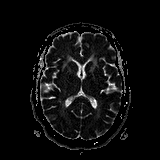
[im 37/49]
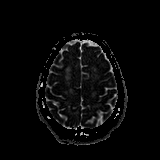
[im 49/49]
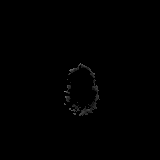

[Series 7: cor dwi_tracew · coronal · 5.0mm · 1.44mm/px · 6 of 68 slices shown]
[im 1/68]
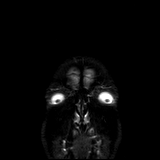
[im 14/68]
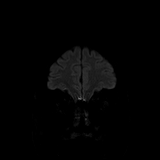
[im 27/68]
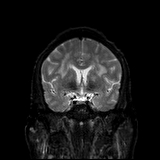
[im 41/68]
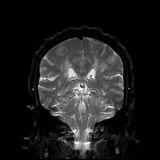
[im 54/68]
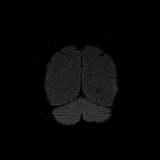
[im 68/68]
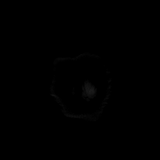

[Series 8: cor dwi_adc · coronal · 5.0mm · 1.44mm/px · 3 of 34 slices shown]
[im 1/34]
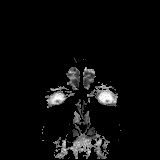
[im 17/34]
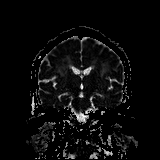
[im 34/34]
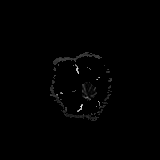

[Series 9: T1 · sagittal · 5.0mm · 0.75mm/px · 2 of 23 slices shown]
[im 1/23]
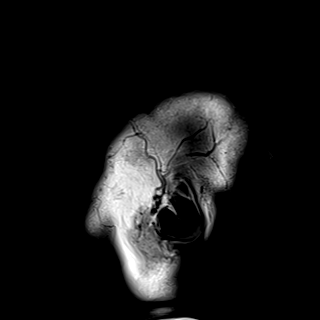
[im 23/23]
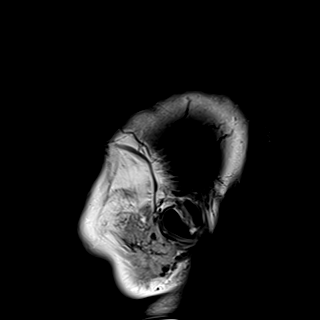

[Series 10: T2 · axial · 5.0mm · 0.72mm/px · z∈[-67,+73]mm · 2 of 25 slices shown (1 of 2)]
[im 1/25]
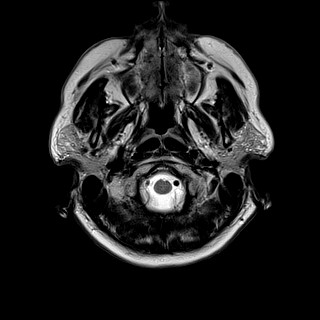
[im 25/25]
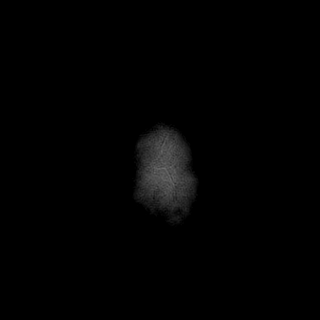

[Series 11: FLAIR · axial · 5.0mm · 0.45mm/px · z∈[-68,+72]mm · 2 of 25 slices shown]
[im 1/25]
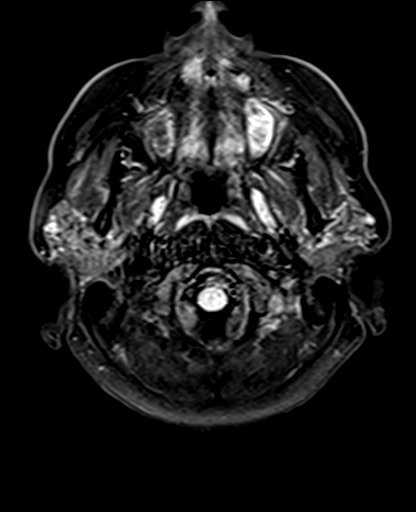
[im 25/25]
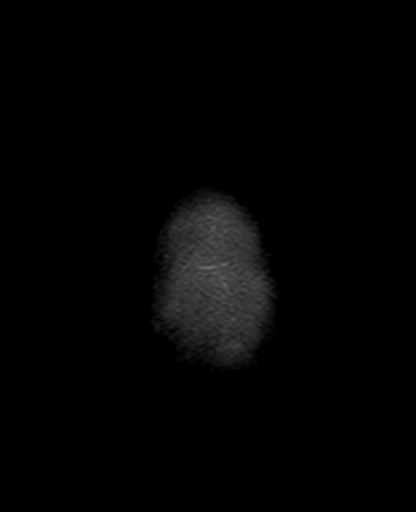

[Series 12: swi_images · axial · 3.0mm · 0.90mm/px · z∈[-85,+87]mm · 5 of 60 slices shown]
[im 1/60]
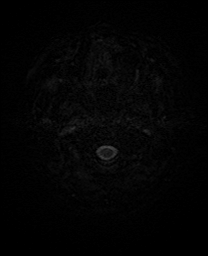
[im 15/60]
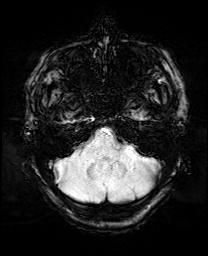
[im 30/60]
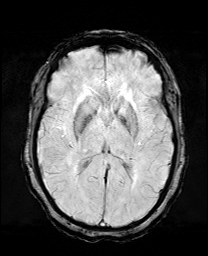
[im 45/60]
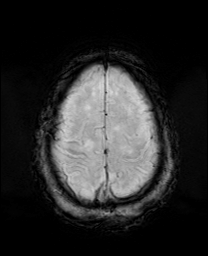
[im 60/60]
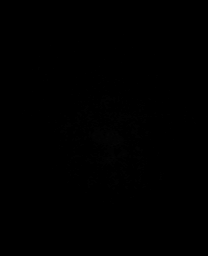

[Series 13: mip_images(sw) · axial · 24.0mm · 0.90mm/px · z∈[-75,+76]mm · 5 of 53 slices shown]
[im 1/53]
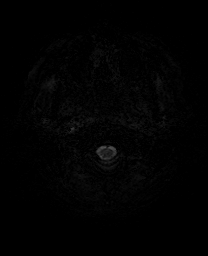
[im 14/53]
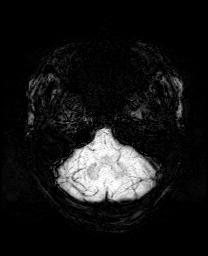
[im 27/53]
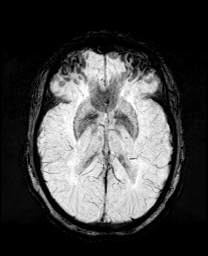
[im 40/53]
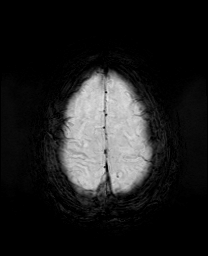
[im 53/53]
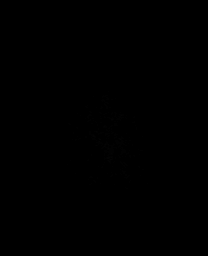

[Series 15: T2 · coronal · 5.0mm · 0.34mm/px · 3 of 29 slices shown (2 of 2)]
[im 1/29]
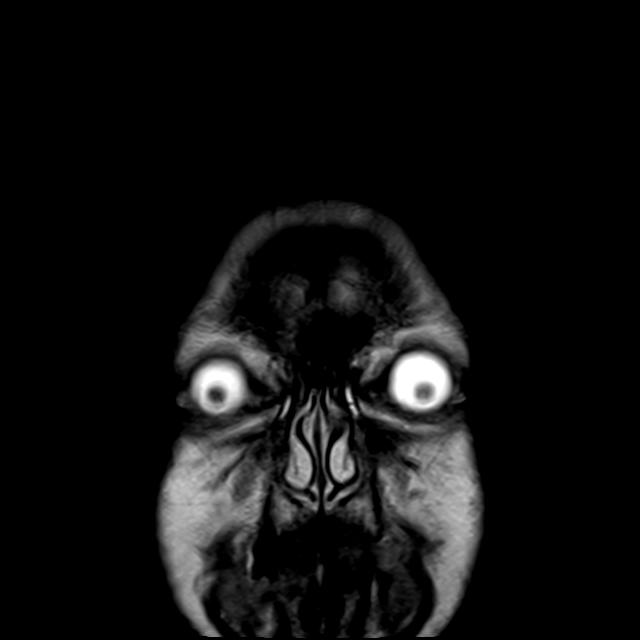
[im 15/29]
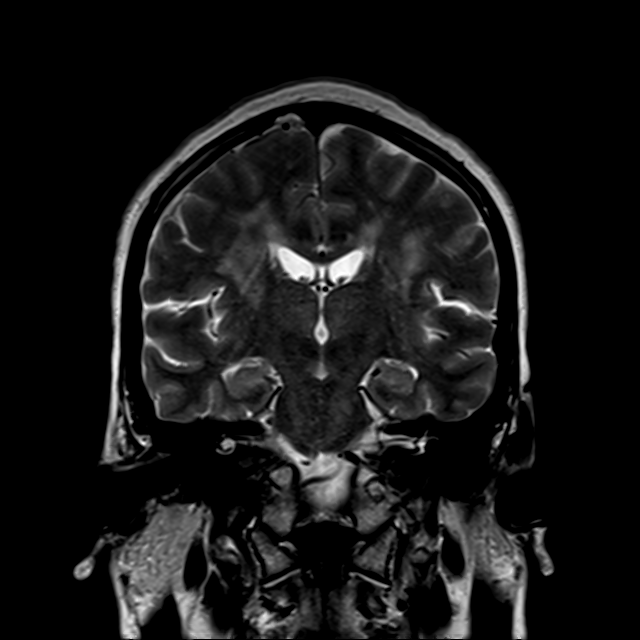
[im 29/29]
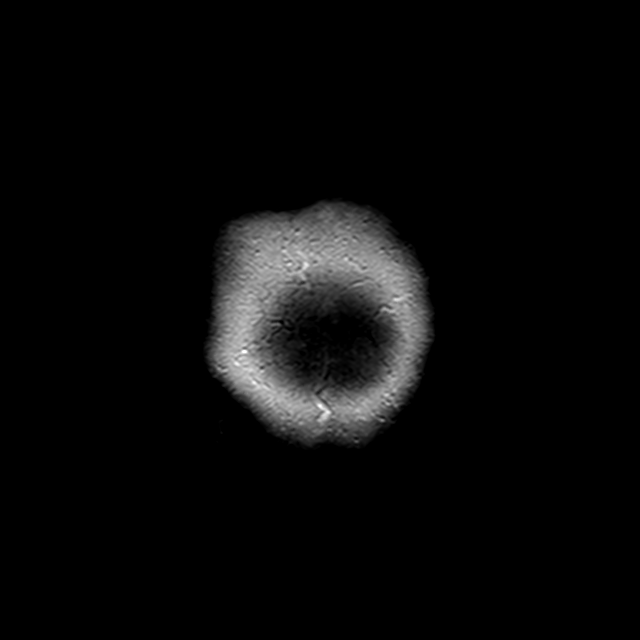

[43 of 48 positions shown; findings below may reference images not displayed]

FINDINGS: Brain: No acute infarction, hemorrhage, hydrocephalus, extra-axial
collection or mass lesion. Lacunar infarct in the left caudate
described on prior head CT is remote. Confluent FLAIR hyperintensity
in the cerebral white matter presumably from patient's hypertension
history. Minimal FLAIR hyperintensity in the deep cerebellar white
matter is presumably related to the same. Negative for PRES.
Negative for atrophy. No chronic blood products.

Vascular: Major flow voids are preserved

Skull and upper cervical spine: No evident marrow lesion

Sinuses/Orbits: Negative
IMPRESSION: 1. No acute finding.
2. Advanced chronic small vessel ischemia in the cerebral white
matter.

## 2020-12-24 ENCOUNTER — Encounter (HOSPITAL_COMMUNITY): Payer: Self-pay | Admitting: Emergency Medicine

## 2020-12-24 ENCOUNTER — Ambulatory Visit (HOSPITAL_COMMUNITY)
Admission: EM | Admit: 2020-12-24 | Discharge: 2020-12-24 | Disposition: A | Discharge status: Home / Self Care | Payer: Self-pay

## 2020-12-24 ENCOUNTER — Other Ambulatory Visit: Payer: Self-pay

## 2020-12-24 DIAGNOSIS — R739 Hyperglycemia, unspecified: Secondary | ICD-10-CM

## 2020-12-24 LAB — CBG MONITORING, ED: Glucose-Capillary: 117 mg/dL — ABNORMAL HIGH (ref 70–99)

## 2020-12-24 NOTE — ED Triage Notes (Signed)
Pt presents with elevated blood sugar and blurry vision xs 3 days. States took insulin up until 2020.   States took blood sugar at 1254 and states blood sugar of 494.

## 2020-12-24 NOTE — Discharge Instructions (Addendum)
Your sugars were 117 in our office.  You should establish with a new PCP.  Call the number at [facility] to make an appointment.  You also need an eye exam.  Continue to stay well hydrated and get a new meter.  If you start to feel ill, can't keep anything down, are peeing more, or have chest pain or difficulty breathing, you should go to the Emergency Room right away.

## 2020-12-24 NOTE — ED Provider Notes (Signed)
[facility]    CSN: [id number] Arrival date & time: 12/24/20  1343      History   Chief Complaint Chief Complaint  Patient presents with   Hyperglycemia    HPI Sarah Sloan is a 65 y.o. female.   Hyperglycemia Patient states that her husband passed away a few weeks ago and Mrs. Sarah Sloan diabetes She reports that she is trying to watch her health more She thought her vision was a little blurry today and checked her CBG around  1254 and she is following a sugar of 494 so she came here She reports a history of diabetes and states that she has previously been on an injectable medication for her diabetes, not specifically insulin She states that she has not been on any medications for her diabetes since 2020 due to lack of insurance and no longer having a PCP She states otherwise she has been feeling well She denies any difficulty eating or drinking or urinating Denies any dizziness or nausea currently   Past Medical History:  Diagnosis Date   Hypertension    Rheumatoid arthritis (HCC)     Patient Active Problem List   Diagnosis Date Noted   Class 3 severe obesity due to excess calories without serious comorbidity with body mass index (BMI) of 40.0 to 44.9 in adult (HCC) 08/31/2018   Insomnia 08/31/2018   Acute non-recurrent frontal sinusitis 07/01/2018   Primary osteoarthritis of both hips 05/03/2018   Primary osteoarthritis of both knees 05/03/2018   Essential hypertension 01/28/2018   Carpal tunnel syndrome, left upper limb 01/28/2018   Tobacco use 01/28/2018   Primary osteoarthritis of both hands 01/28/2018   DDD (degenerative disc disease), lumbar 01/28/2018    Past Surgical History:  Procedure Laterality Date   HAND SURGERY Left    x2   TONSILLECTOMY     age 39     OB History   No obstetric history on file.      Home Medications    Prior to Admission medications   Medication Sig Start Date End Date Taking? Authorizing Provider  aspirin  81 MG chewable tablet Chew by mouth daily.   Yes [provider]  Multiple Vitamins-Minerals (MULTIVITAMIN WITH MINERALS) tablet Take 1 tablet by mouth daily.   Yes [provider]  albuterol (PROVENTIL HFA;VENTOLIN HFA) 108 (90 Base) MCG/ACT inhaler INHALE 2 PUFF BY MOUTH EVERY 4 TO 6 HOURS AS NEEDED 09/05/18   Arnette Felts, FNP  B-D ULTRAFINE III SHORT PEN 31G X 8 MM MISC USE AS DIRECTED WITH SAXENDA 09/22/18   Arnette Felts, FNP  Cholecalciferol (VITAMIN D PO) Take 1 tablet by mouth once a week.     [provider]  diclofenac sodium (VOLTAREN) 1 % GEL Apply 3 grams to 3 large joints up to 3 times daily 01/28/18   Gearldine Bienenstock, PA-C  doxycycline (VIBRA-TABS) 100 MG tablet Take 1 tablet (100 mg total) by mouth 2 (two) times daily. Patient not taking: Reported on 08/31/2018 07/01/18   Arnette Felts, FNP  GARLIC-PARSLEY PO Take 1 tablet by mouth daily.    [provider]  ibuprofen (ADVIL,MOTRIN) 600 MG tablet Take 1 tablet (600 mg total) by mouth every 6 (six) hours as needed. Patient not taking: Reported on 08/31/2018 06/17/16   Marlon Pel, PA-C  Liraglutide -Weight Management (SAXENDA) 18 MG/3ML SOPN Inject 3 mg into the skin daily. 08/31/18   Arnette Felts, FNP  olmesartan (BENICAR) 20 MG tablet Take 1 tablet (20 mg  total) by mouth daily. PATIENT NEEDS AN APPOINTMENT BEFORE REFILL IS OUT 12/22/18   Arnette Felts, FNP  SYMBICORT 160-4.5 MCG/ACT inhaler INHALE 2 PUFFS BY MOUTH TWICE A DAY ( IN THE MORNING AND EVENING ) 05/10/18   Arnette Felts, FNP    Family History Family History  Problem Relation Age of Onset   Rheum arthritis Mother    Rheum arthritis Sister    Rheum arthritis Brother    Breast cancer Maternal Grandmother 13    Social History Social History   Tobacco Use   Smoking status: Some Days    Packs/day: 0.25    Years: 49.00    Pack years: 12.25    Types: Cigarettes   Smokeless tobacco: Never  Vaping Use   Vaping Use: Former  Substance  Use Topics   Alcohol use: Yes    Comment: occ   Drug use: Not Currently     Allergies   Penicillins   Review of Systems Review of Systems  Constitutional:  Negative for activity change, appetite change, fatigue and fever.  HENT:  Negative for congestion.   Eyes:        Blurry vision  Respiratory:  Negative for cough and shortness of breath.   Cardiovascular:  Negative for chest pain and leg swelling.  Gastrointestinal:  Negative for abdominal pain, diarrhea, nausea and vomiting.  Endocrine: Negative for polydipsia and polyuria.  Genitourinary:  Negative for difficulty urinating.  Skin:  Negative for rash.  Neurological:  Negative for dizziness, syncope and weakness.    Physical Exam Triage Vital Signs ED Triage Vitals [12/24/20 1351]  Enc Vitals Group     BP      Pulse      Resp      Temp      Temp src      SpO2      Weight      Height      Head Circumference      Peak Flow      Pain Score 0     Pain Loc      Pain Edu?      Excl. in GC?    No data found.  Updated Vital Signs There were no vitals taken for this visit.  Visual Acuity Right Eye Distance:   Left Eye Distance:   Bilateral Distance:    Right Eye Near:   Left Eye Near:    Bilateral Near:     Physical Exam Constitutional:      General: She is not in acute distress.    Appearance: Normal appearance. She is not ill-appearing, toxic-appearing or diaphoretic.  HENT:     Head: Normocephalic and atraumatic.     Mouth/Throat:     Mouth: Mucous membranes are moist.  Eyes:     Extraocular Movements: Extraocular movements intact.     Pupils: Pupils are equal, round, and reactive to light.  Cardiovascular:     Rate and Rhythm: Normal rate and regular rhythm.     Heart sounds: No murmur heard.   No friction rub. No gallop.  Pulmonary:     Effort: Pulmonary effort is normal.     Breath sounds: Normal breath sounds. No wheezing, rhonchi or rales.  Musculoskeletal:     Right lower leg: No edema.      Left lower leg: No edema.  Skin:    General: Skin is warm and dry.     Capillary Refill: Capillary refill takes less than 2 seconds.  Neurological:  General: No focal deficit present.     Mental Status: She is alert and oriented to person, place, and time.  Psychiatric:        Mood and Affect: Mood normal.        Behavior: Behavior normal.        Thought Content: Thought content normal.        Judgment: Judgment normal.     UC Treatments / Results  Labs (all labs ordered are listed, but only abnormal results are displayed) Labs Reviewed  CBG MONITORING, ED - Abnormal; Notable for the following components:      Result Value   Glucose-Capillary 117 (*)    All other components within normal limits    EKG   Radiology No results found.  Procedures Procedures (including critical care time)  Medications Ordered in UC Medications - No data to display  Initial Impression / Assessment and Plan / UC Course  I have reviewed the triage vital signs and the nursing notes.  Pertinent labs & imaging results that were available during my care of the patient were reviewed by me and considered in my medical decision making (see chart for details).     65 year old female with past medical history significant for hypertension and self-reported diabetes who presents with concern for hyperglycemia.  Patient's point-of-care CBG in office is 117.  Few minutes after this was checked, have the patient check on her own meter and it read in the 400s.  Discussed with her that her meter may need to be calibrated, she has had this meter for over 5 years and she uses it very sporadically.  She has having no signs and symptoms of HHS or DKA.  Offered patient formal lab work including a BMP, however she declines that she is self-pay and overall feeling well and reassured by her CBG.  Did recommend that she follow-up with [facility] given that she does not have any insurance.   Advised that she follow-up with them for her usual care, care of diabetes, and to help establish her with an eye doctor.  As far as her blurry vision, this is a chronic issue.  She has neurologically intact on exam.  Did advise eye exam especially given her diabetes.  She was discharged home in stable condition.  Final Clinical Impressions(s) / UC Diagnoses   Final diagnoses:  Hyperglycemia     Discharge Instructions      Your sugars were 117 in our office.  You should establish with a new PCP.  Call the number at [facility] to make an appointment.  You also need an eye exam.  Continue to stay well hydrated and get a new meter.  If you start to feel ill, can't keep anything down, are peeing more, or have chest pain or difficulty breathing, you should go to the Emergency Room right away.     ED Prescriptions   None    PDMP not reviewed this encounter.   Unknown Jim, DO 12/24/20 1441

## 2022-03-26 ENCOUNTER — Encounter (INDEPENDENT_AMBULATORY_CARE_PROVIDER_SITE_OTHER): Payer: Self-pay | Admitting: Primary Care

## 2022-03-26 ENCOUNTER — Ambulatory Visit (INDEPENDENT_AMBULATORY_CARE_PROVIDER_SITE_OTHER): Payer: Medicare Other | Admitting: Primary Care

## 2022-03-26 VITALS — BP 165/90 | HR 64 | Resp 16 | Ht 68.0 in | Wt 286.0 lb

## 2022-03-26 DIAGNOSIS — Z7689 Persons encountering health services in other specified circumstances: Secondary | ICD-10-CM

## 2022-03-26 DIAGNOSIS — F17210 Nicotine dependence, cigarettes, uncomplicated: Secondary | ICD-10-CM | POA: Diagnosis not present

## 2022-03-26 DIAGNOSIS — Z131 Encounter for screening for diabetes mellitus: Secondary | ICD-10-CM | POA: Diagnosis not present

## 2022-03-26 DIAGNOSIS — Z1159 Encounter for screening for other viral diseases: Secondary | ICD-10-CM

## 2022-03-26 DIAGNOSIS — Z720 Tobacco use: Secondary | ICD-10-CM

## 2022-03-26 DIAGNOSIS — Z6841 Body Mass Index (BMI) 40.0 and over, adult: Secondary | ICD-10-CM

## 2022-03-26 DIAGNOSIS — Z1231 Encounter for screening mammogram for malignant neoplasm of breast: Secondary | ICD-10-CM

## 2022-03-26 DIAGNOSIS — Z13220 Encounter for screening for lipoid disorders: Secondary | ICD-10-CM

## 2022-03-26 DIAGNOSIS — M545 Low back pain, unspecified: Secondary | ICD-10-CM

## 2022-03-26 DIAGNOSIS — Z833 Family history of diabetes mellitus: Secondary | ICD-10-CM

## 2022-03-26 DIAGNOSIS — I10 Essential (primary) hypertension: Secondary | ICD-10-CM

## 2022-03-26 DIAGNOSIS — E66813 Obesity, class 3: Secondary | ICD-10-CM

## 2022-03-26 DIAGNOSIS — E2839 Other primary ovarian failure: Secondary | ICD-10-CM | POA: Diagnosis not present

## 2022-03-26 DIAGNOSIS — Z9189 Other specified personal risk factors, not elsewhere classified: Secondary | ICD-10-CM

## 2022-03-26 DIAGNOSIS — G8929 Other chronic pain: Secondary | ICD-10-CM | POA: Diagnosis not present

## 2022-03-26 DIAGNOSIS — Z114 Encounter for screening for human immunodeficiency virus [HIV]: Secondary | ICD-10-CM

## 2022-03-26 MED ORDER — LIDOCAINE 4 % EX PTCH: 1.0000 | MEDICATED_PATCH | CUTANEOUS | 1 refills | Status: DC

## 2022-03-26 MED ORDER — VALSARTAN-HYDROCHLOROTHIAZIDE 160-25 MG PO TABS: 1.0000 | ORAL_TABLET | Freq: Every day | ORAL | 3 refills | Status: AC

## 2022-03-26 NOTE — Patient Instructions (Addendum)
Calorie Counting for Weight Loss Calories are units of energy. Your body needs a certain number of calories from food to keep going throughout the day. When you eat or drink more calories than your body needs, your body stores the extra calories mostly as fat. When you eat or drink fewer calories than your body needs, your body burns fat to get the energy it needs. Calorie counting means keeping track of how many calories you eat and drink each day. Calorie counting can be helpful if you need to lose weight. If you eat fewer calories than your body needs, you should lose weight. Ask your health care provider what a healthy weight is for you. For calorie counting to work, you will need to eat the right number of calories each day to lose a healthy amount of weight per week. A dietitian can help you figure out how many calories you need in a day and will suggest ways to reach your calorie goal. A healthy amount of weight to lose each week is usually 1-2 lb (0.5-0.9 kg). This usually means that your daily calorie intake should be reduced by 500-750 calories. Eating 1,200-1,500 calories a day can help most women lose weight. Eating 1,500-1,800 calories a day can help most men lose weight. What do I need to know about calorie counting? Work with your health care provider or dietitian to determine how many calories you should get each day. To meet your daily calorie goal, you will need to: Find out how many calories are in each food that you would like to eat. Try to do this before you eat. Decide how much of the food you plan to eat. Keep a food log. Do this by writing down what you ate and how many calories it had. To successfully lose weight, it is important to balance calorie counting with a healthy lifestyle that includes regular activity. Where do I find calorie information?  The number of calories in a food can be found on a Nutrition Facts label. If a food does not have a Nutrition Facts label, try  to look up the calories online or ask your dietitian for help. Remember that calories are listed per serving. If you choose to have more than one serving of a food, you will have to multiply the calories per serving by the number of servings you plan to eat. For example, the label on a package of bread might say that a serving size is 1 slice and that there are 90 calories in a serving. If you eat 1 slice, you will have eaten 90 calories. If you eat 2 slices, you will have eaten 180 calories. How do I keep a food log? After each time that you eat, record the following in your food log as soon as possible: What you ate. Be sure to include toppings, sauces, and other extras on the food. How much you ate. This can be measured in cups, ounces, or number of items. How many calories were in each food and drink. The total number of calories in the food you ate. Keep your food log near you, such as in a pocket-sized notebook or on an app or website on your mobile phone. Some programs will calculate calories for you and show you how many calories you have left to meet your daily goal. What are some portion-control tips? Know how many calories are in a serving. This will help you know how many servings you can have of a certain   food. Use a measuring cup to measure serving sizes. You could also try weighing out portions on a kitchen scale. With time, you will be able to estimate serving sizes for some foods. Take time to put servings of different foods on your favorite plates or in your favorite bowls and cups so you know what a serving looks like. Try not to eat straight from a food's packaging, such as from a bag or box. Eating straight from the package makes it hard to see how much you are eating and can lead to overeating. Put the amount you would like to eat in a cup or on a plate to make sure you are eating the right portion. Use smaller plates, glasses, and bowls for smaller portions and to prevent  overeating. Try not to multitask. For example, avoid watching TV or using your computer while eating. If it is time to eat, sit down at a table and enjoy your food. This will help you recognize when you are full. It will also help you be more mindful of what and how much you are eating. What are tips for following this plan? Reading food labels Check the calorie count compared with the serving size. The serving size may be smaller than what you are used to eating. Check the source of the calories. Try to choose foods that are high in protein, fiber, and vitamins, and low in saturated fat, trans fat, and sodium. Shopping Read nutrition labels while you shop. This will help you make healthy decisions about which foods to buy. Pay attention to nutrition labels for low-fat or fat-free foods. These foods sometimes have the same number of calories or more calories than the full-fat versions. They also often have added sugar, starch, or salt to make up for flavor that was removed with the fat. Make a grocery list of lower-calorie foods and stick to it. Cooking Try to cook your favorite foods in a healthier way. For example, try baking instead of frying. Use low-fat dairy products. Meal planning Use more fruits and vegetables. One-half of your plate should be fruits and vegetables. Include lean proteins, such as chicken, turkey, and fish. Lifestyle Each week, aim to do one of the following: 150 minutes of moderate exercise, such as walking. 75 minutes of vigorous exercise, such as running. General information Know how many calories are in the foods you eat most often. This will help you calculate calorie counts faster. Find a way of tracking calories that works for you. Get creative. Try different apps or programs if writing down calories does not work for you. What foods should I eat?  Eat nutritious foods. It is better to have a nutritious, high-calorie food, such as an avocado, than a food with  few nutrients, such as a bag of potato chips. Use your calories on foods and drinks that will fill you up and will not leave you hungry soon after eating. Examples of foods that fill you up are nuts and nut butters, vegetables, lean proteins, and high-fiber foods such as whole grains. High-fiber foods are foods with more than 5 g of fiber per serving. Pay attention to calories in drinks. Low-calorie drinks include water and unsweetened drinks. The items listed above may not be a complete list of foods and beverages you can eat. Contact a dietitian for more information. What foods should I limit? Limit foods or drinks that are not good sources of vitamins, minerals, or protein or that are high in unhealthy fats. These   include: Candy. Other sweets. Sodas, specialty coffee drinks, alcohol, and juice. The items listed above may not be a complete list of foods and beverages you should avoid. Contact a dietitian for more information. How do I count calories when eating out? Pay attention to portions. Often, portions are much larger when eating out. Try these tips to keep portions smaller: Consider sharing a meal instead of getting your own. If you get your own meal, eat only half of it. Before you start eating, ask for a container and put half of your meal into it. When available, consider ordering smaller portions from the menu instead of full portions. Pay attention to your food and drink choices. Knowing the way food is cooked and what is included with the meal can help you eat fewer calories. If calories are listed on the menu, choose the lower-calorie options. Choose dishes that include vegetables, fruits, whole grains, low-fat dairy products, and lean proteins. Choose items that are boiled, broiled, grilled, or steamed. Avoid items that are buttered, battered, fried, or served with cream sauce. Items labeled as crispy are usually fried, unless stated otherwise. Choose water, low-fat milk,  unsweetened iced tea, or other drinks without added sugar. If you want an alcoholic beverage, choose a lower-calorie option, such as a glass of wine or light beer. Ask for dressings, sauces, and syrups on the side. These are usually high in calories, so you should limit the amount you eat. If you want a salad, choose a garden salad and ask for grilled meats. Avoid extra toppings such as bacon, cheese, or fried items. Ask for the dressing on the side, or ask for olive oil and vinegar or lemon to use as dressing. Estimate how many servings of a food you are given. Knowing serving sizes will help you be aware of how much food you are eating at restaurants. Where to find more information Centers for Disease Control and Prevention: www.cdc.gov [place] Department of Agriculture: myplate.gov Summary Calorie counting means keeping track of how many calories you eat and drink each day. If you eat fewer calories than your body needs, you should lose weight. A healthy amount of weight to lose per week is usually 1-2 lb (0.5-0.9 kg). This usually means reducing your daily calorie intake by 500-750 calories. The number of calories in a food can be found on a Nutrition Facts label. If a food does not have a Nutrition Facts label, try to look up the calories online or ask your dietitian for help. Use smaller plates, glasses, and bowls for smaller portions and to prevent overeating. Use your calories on foods and drinks that will fill you up and not leave you hungry shortly after a meal. This information is not intended to replace advice given to you by your health care provider. Make sure you discuss any questions you have with your health care provider. Document Revised: 06/29/2019 Document Reviewed: 06/29/2019 Elsevier Patient Education  2023 Elsevier Inc. Valsartan; Hydrochlorothiazide Tablets What is this medication? VALSARTAN; HYDROCHLOROTHIAZIDE (val SAR tan; hye droe klor oh THYE a zide) treats high blood  pressure. It relaxes your blood vessels and helps your kidneys remove more fluid through the urine, which lowers blood pressure. It is a combination of an ARB and diuretic. This medicine may be used for other purposes; ask your health care provider or pharmacist if you have questions. COMMON BRAND NAME(S): Diovan HCT What should I tell my care team before I take this medication? They need to know if you   have any of these conditions: Diabetes Gout Heart failure Kidney disease Liver disease Lupus Pancreatitis An unusual or allergic reaction to valsartan, hydrochlorothiazide, sulfa medications, other medications, foods, dyes, or preservatives Pregnant or trying to get pregnant Breast-feeding How should I use this medication? Take this medication by mouth. Take it as directed on the prescription label at the same time every day. You can take it with or without food. If it upsets your stomach, take it with food. Keep taking it unless your care team tells you to stop. Talk to your care team about the use of this medication in children. Special care may be needed. Overdosage: If you think you have taken too much of this medicine contact a poison control center or emergency room at once. NOTE: This medicine is only for you. Do not share this medicine with others. What if I miss a dose? If you miss a dose, take it as soon as you can. If it is almost time for your next dose, take only that dose. Do not take double or extra doses. What may interact with this medication? Do not take this medication with any of the following: Cidofovir Dofetilide This medication may also interact with the following: Aliskiren ACE inhibitors, like enalapril or lisinopril Cholestyramine Colestipol Digoxin Lithium Medications for blood pressure Medications for diabetes Medications that relax muscles for surgery NSAIDs, medications for pain and inflammation, like ibuprofen or naproxen Potassium salts or potassium  supplements Other diuretics, especially triamterene, spironolactone, or amiloride Steroid medications like prednisone or cortisone This list may not describe all possible interactions. Give your health care provider a list of all the medicines, herbs, non-prescription drugs, or dietary supplements you use. Also tell them if you smoke, drink alcohol, or use illegal drugs. Some items may interact with your medicine. What should I watch for while using this medication? Visit your care team for regular check-ups. Check your blood pressure as directed. Ask your care team what your blood pressure should be. Also, find out when you should contact them. Do not treat yourself for coughs, colds, or pain while you are using this medication without asking your care team for advice. Some medications may increase your blood pressure. Women should inform their care team if they wish to become pregnant or think they might be pregnant. There is a potential for serious side effects to an unborn child. Talk to your care team for more information. You may get drowsy or dizzy. Do not drive, use machinery, or do anything that needs mental alertness until you know how this medication affects you. Do not stand or sit up quickly, especially if you are an older patient. This reduces the risk of dizzy or fainting spells. Alcohol can make you more drowsy and dizzy. Avoid alcoholic drinks. Talk to your care team about your risk of skin cancer. You may be more at risk for skin cancer if you take this medication. This medication can make you more sensitive to the sun. Keep out of the sun. If you cannot avoid being in the sun, wear protective clothing and use sunscreen. Do not use sun lamps or tanning beds/booths. Avoid salt substitutes unless you are told otherwise by your care team. You may need to be on a special diet while taking this medication. Ask your care team. Also, find out how many glasses of fluids you need to drink each  day. Check with your care team if you get an attack of severe diarrhea, nausea and vomiting, or if you   sweat a lot. The loss of too much body fluid can make it dangerous for you to take this medication. This medication may increase blood sugar. Ask your care team if changes in diet or medications are needed if you have diabetes. What side effects may I notice from receiving this medication? Side effects that you should report to your care team as soon as possible: Allergic reactions--skin rash, itching, hives, swelling of the face, lips, tongue, or throat Dehydration--increased thirst, dry mouth, feeling faint or lightheaded, headache, dark yellow or brown urine Gout--severe pain, redness, warmth, or swelling in joints, such as the big toe Kidney injury--decrease in the amount of urine, swelling of the ankles, hands, or feet Low blood pressure--dizziness, feeling faint or lightheaded, blurry vision Low potassium level--muscle pain or cramps, unusual weakness or fatigue, fast or irregular heartbeat, constipation Sudden eye pain or change in vision such as blurry vision, seeing halos around lights, vision loss Side effects that usually do not require medical attention (report to your care team if they continue or are bothersome): Change in sex drive or performance Dizziness Fatigue Headache Upset stomach This list may not describe all possible side effects. Call your doctor for medical advice about side effects. You may report side effects to FDA at 1-800-FDA-1088. Where should I keep my medication? Keep out of the reach of children and pets. Store at room temperature between 20 and 25 degrees C (68 and 77 degrees F). Protect from moisture. Keep the container tightly closed. Get rid of any unused medication after the expiration date. To get rid of medications that are no longer needed or have expired: Take the medications to a medication take-back program. Check with your pharmacy or law  enforcement to find a location. If you cannot return the medication, check the label or package insert to see if the medication should be thrown out in the garbage or flushed down the toilet. If you are not sure, ask your care team. If it is safe to put in the trash, empty the medication out of the container. Mix the medication with cat litter, dirt, coffee grounds, or other unwanted substance. Seal the mixture in a bag or container. Put it in the trash. NOTE: This sheet is a summary. It may not cover all possible information. If you have questions about this medicine, talk to your doctor, pharmacist, or health care provider.  2023 Elsevier/Gold Standard (2020-12-17 00:00:00)  

## 2022-03-26 NOTE — Progress Notes (Signed)
New Patient Office Visit  Subjective    Patient ID: Sarah Sloan, female    DOB: 09-20-1955  Age: 66 y.o. MRN: [id number]  CC:  Chief Complaint  Patient presents with   New Patient (Initial Visit)    HPI SarahARRON Sloan is a 66 year old morbid obese female who presents to establish care.  She has not seen her previous PCP in over 4 years her blood pressure is elevated 165/90.  She is in chronic pain as are head, behind her eye, lower back and right sided.  She rates her pain as 7 out of 10.  She uses OTC Tylenol, heating pad, turmeric, and hot showers this only gives her a little bit of relief.  She endorses shortness of breath with exertion. Patient has No chest pain, No abdominal pain - No Nausea, No new weakness tingling or numbness, or No Cough  Outpatient Encounter Medications as of 03/26/2022  Medication Sig   aspirin 81 MG chewable tablet Chew by mouth daily.   lidocaine 4 % Place 1 patch onto the skin daily.   Multiple Vitamins-Minerals (MULTIVITAMIN WITH MINERALS) tablet Take 1 tablet by mouth daily.   valsartan-hydrochlorothiazide (DIOVAN-HCT) 160-25 MG tablet Take 1 tablet by mouth daily.   albuterol (PROVENTIL HFA;VENTOLIN HFA) 108 (90 Base) MCG/ACT inhaler INHALE 2 PUFF BY MOUTH EVERY 4 TO 6 HOURS AS NEEDED (Patient not taking: Reported on 03/26/2022)   B-D ULTRAFINE III SHORT PEN 31G X 8 MM MISC USE AS DIRECTED WITH SAXENDA (Patient not taking: Reported on 03/26/2022)   Cholecalciferol (VITAMIN D PO) Take 1 tablet by mouth once a week.  (Patient not taking: Reported on 03/26/2022)   diclofenac sodium (VOLTAREN) 1 % GEL Apply 3 grams to 3 large joints up to 3 times daily (Patient not taking: Reported on 03/26/2022)   doxycycline (VIBRA-TABS) 100 MG tablet Take 1 tablet (100 mg total) by mouth 2 (two) times daily. (Patient not taking: Reported on 02/01/7901)   GARLIC-PARSLEY PO Take 1 tablet by mouth daily. (Patient not taking: Reported on 03/26/2022)   ibuprofen  (ADVIL,MOTRIN) 600 MG tablet Take 1 tablet (600 mg total) by mouth every 6 (six) hours as needed. (Patient not taking: Reported on 08/31/2018)   Liraglutide -Weight Management (SAXENDA) 18 MG/3ML SOPN Inject 3 mg into the skin daily. (Patient not taking: Reported on 03/26/2022)   SYMBICORT 160-4.5 MCG/ACT inhaler INHALE 2 PUFFS BY MOUTH TWICE A DAY ( IN THE MORNING AND EVENING ) (Patient not taking: Reported on 03/26/2022)   [DISCONTINUED] olmesartan (BENICAR) 20 MG tablet Take 1 tablet (20 mg total) by mouth daily. PATIENT NEEDS AN APPOINTMENT BEFORE REFILL IS OUT (Patient not taking: Reported on 03/26/2022)   No facility-administered encounter medications on file as of 03/26/2022.    Past Medical History:  Diagnosis Date   Hypertension    Rheumatoid arthritis ([place])     Past Surgical History:  Procedure Laterality Date   HAND SURGERY Left    x2   TONSILLECTOMY     age 6     Family History  Problem Relation Age of Onset   Rheum arthritis Mother    Rheum arthritis Sister    Rheum arthritis Brother    Breast cancer Maternal Grandmother 27    Social History   Socioeconomic History   Marital status: Married    Spouse name: Not on file   Number of children: Not on file   Years of education: Not on file   Highest education level: Not  on file  Occupational History   Not on file  Tobacco Use   Smoking status: Some Days    Packs/day: 0.25    Years: 49.00    Total pack years: 12.25    Types: Cigarettes   Smokeless tobacco: Never  Vaping Use   Vaping Use: Former  Substance and Sexual Activity   Alcohol use: Yes    Comment: occ   Drug use: Not Currently   Sexual activity: Not Currently  Other Topics Concern   Not on file  Social History Narrative   Not on file   Social Determinants of Health   Financial Resource Strain: Not on file  Food Insecurity: Not on file  Transportation Needs: Not on file  Physical Activity: Not on file  Stress: Not on file  Social  Connections: Not on file  Intimate Partner Violence: Not on file    ROS Comprehensive ROS Pertinent positive and negative noted in HPI       Objective    BP (!) 165/90   Pulse 64   Resp 16   Ht $R'5\' 8"'YI$  (1.727 m)   Wt 286 lb (129.7 kg)   SpO2 95%   BMI 43.49 kg/m   Physical exam: General: Vital signs reviewed.  Patient is well-developed and well-nourished,  morbid obese  in no acute distress and cooperative with exam. Head: Normocephalic and atraumatic. Eyes: EOMI, conjunctivae normal, no scleral icterus. Neck: Supple, trachea midline, normal ROM, no JVD, masses, thyromegaly, or carotid bruit present. Cardiovascular: RRR, S1 normal, S2 normal, no murmurs, gallops, or rubs. Pulmonary/Chest: Clear to auscultation bilaterally, no wheezes, rales, or rhonchi. Abdominal: Soft, non-tender, non-distended, BS +, no masses, organomegaly, or guarding present. Musculoskeletal: No joint deformities, erythema, or stiffness, ROM full and nontender. Extremities: No lower extremity edema bilaterally,  pulses symmetric and intact bilaterally. No cyanosis or clubbing. Neurological: A&O x3, Strength is normal Skin: Warm, dry and intact. No rashes or erythema. Psychiatric: Normal mood and affect. speech and behavior is normal. Cognition and memory are normal.        Assessment & Plan:  Sarah Sloan was seen today for new patient (initial visit).  Diagnoses and all orders for this visit:  Encounter to establish care -     Hemoglobin A1c -     valsartan-hydrochlorothiazide (DIOVAN-HCT) 160-25 MG tablet; Take 1 tablet by mouth daily.   Essential hypertension BP goal - <140/90 explained that having normal blood pressure is the goal and medications are helping to get to goal and maintain normal blood pressure. DIET: Limit salt intake, read nutrition labels to check salt content, limit fried and high fatty foods  Avoid using multisymptom OTC cold preparations that generally contain sudafed which can  rise BP. Consult with pharmacist on best cold relief products to use for persons with HTN EXERCISE Discussed incorporating exercise such as walking - 30 minutes most days of the week and can do in 10 minute intervals    -     CBC -     CMP14+EGFR  Tobacco use - I have recommended complete cessation of tobacco use. I have discussed various options available for assistance with tobacco cessation including over the counter methods (Nicotine gum, patch and lozenges). We also discussed prescription options (Chantix, Nicotine Inhaler / Nasal Spray). The patient is not interested in pursuing any prescription tobacco cessation options at this time. - Patient declines at this time.    Encounter for HCV screening test for high risk patient -  HCV Ab w Reflex to Quant PCR  Lipid screening Increase cardiovascular risks with obesity, smoking and hypertension  Screening for diabetes mellitus 2/2 Family history of diabetes mellitus (DM) -     Hemoglobin A1c  Screening for HIV (human immunodeficiency virus) -     HIV Antibody (routine testing w rflx)  Encounter for screening mammogram for malignant neoplasm of breast -     MM DIGITAL SCREENING BILATERAL; Future  Estrogen deficiency -     DG Bone Density; Future  Chronic bilateral low back pain without sciatica 2/2 Class 3 severe obesity due to excess calories without serious comorbidity with body mass index (BMI) of 40.0 to 44.9 in adult [facility]) Morbid obesity is then 40 BMI indicating an excess in caloric intake or underlining conditions. This may have lead to hypertension and chronic back pain. Educated on lifestyle modifications of diet and exercise which may reduce obesity.   -     Lipid panel    Other orders -     lidocaine 4 %; Place 1 patch onto the skin daily. -     Interpretation:     Return for 1st avaiable BP ck.   Kerin Perna, NP

## 2022-03-27 LAB — CBC
Hematocrit: 41.3 % (ref 34.0–46.6)
Hemoglobin: 13.6 g/dL (ref 11.1–15.9)
MCH: 29.9 pg (ref 26.6–33.0)
MCHC: 32.9 g/dL (ref 31.5–35.7)
MCV: 91 fL (ref 79–97)
Platelets: 327 10*3/uL (ref 150–450)
RBC: 4.55 x10E6/uL (ref 3.77–5.28)
RDW: 13.2 % (ref 11.7–15.4)
WBC: 4.3 10*3/uL (ref 3.4–10.8)

## 2022-03-27 LAB — HCV INTERPRETATION

## 2022-03-27 LAB — CMP14+EGFR
ALT: 37 IU/L — ABNORMAL HIGH (ref 0–32)
AST: 25 IU/L (ref 0–40)
Albumin/Globulin Ratio: 1.8 (ref 1.2–2.2)
Albumin: 4.3 g/dL (ref 3.9–4.9)
Alkaline Phosphatase: 99 IU/L (ref 44–121)
BUN/Creatinine Ratio: 13 (ref 12–28)
BUN: 10 mg/dL (ref 8–27)
Bilirubin Total: 0.3 mg/dL (ref 0.0–1.2)
CO2: 23 mmol/L (ref 20–29)
Calcium: 9.8 mg/dL (ref 8.7–10.3)
Chloride: 102 mmol/L (ref 96–106)
Creatinine, Ser: 0.75 mg/dL (ref 0.57–1.00)
Globulin, Total: 2.4 g/dL (ref 1.5–4.5)
Glucose: 114 mg/dL — ABNORMAL HIGH (ref 70–99)
Potassium: 4.2 mmol/L (ref 3.5–5.2)
Sodium: 140 mmol/L (ref 134–144)
Total Protein: 6.7 g/dL (ref 6.0–8.5)
eGFR: 88 mL/min/{1.73_m2} (ref >59)

## 2022-03-27 LAB — LIPID PANEL
Chol/HDL Ratio: 3.3 ratio (ref 0.0–4.4)
Cholesterol, Total: 242 mg/dL — ABNORMAL HIGH (ref 100–199)
HDL: 73 mg/dL (ref >39)
LDL Chol Calc (NIH): 155 mg/dL — ABNORMAL HIGH (ref 0–99)
Triglycerides: 81 mg/dL (ref 0–149)
VLDL Cholesterol Cal: 14 mg/dL (ref 5–40)

## 2022-03-27 LAB — HEMOGLOBIN A1C
Est. average glucose Bld gHb Est-mCnc: 137 mg/dL
Hgb A1c MFr Bld: 6.4 % — ABNORMAL HIGH (ref 4.8–5.6)

## 2022-03-27 LAB — HIV ANTIBODY (ROUTINE TESTING W REFLEX): HIV Screen 4th Generation wRfx: NONREACTIVE

## 2022-03-27 LAB — HCV AB W REFLEX TO QUANT PCR: HCV Ab: NONREACTIVE

## 2022-03-28 ENCOUNTER — Other Ambulatory Visit (INDEPENDENT_AMBULATORY_CARE_PROVIDER_SITE_OTHER): Payer: Self-pay | Admitting: Primary Care

## 2022-03-28 DIAGNOSIS — E782 Mixed hyperlipidemia: Secondary | ICD-10-CM

## 2022-03-28 MED ORDER — ATORVASTATIN CALCIUM 40 MG PO TABS: 40.0000 mg | ORAL_TABLET | Freq: Every day | ORAL | 3 refills | Status: AC

## 2022-03-30 ENCOUNTER — Telehealth (INDEPENDENT_AMBULATORY_CARE_PROVIDER_SITE_OTHER): Payer: Self-pay

## 2022-03-30 NOTE — Telephone Encounter (Signed)
Contacted pt to go over lab results pt is aware and doesn't have any questions or concerns 

## 2022-04-08 ENCOUNTER — Ambulatory Visit (INDEPENDENT_AMBULATORY_CARE_PROVIDER_SITE_OTHER): Payer: Medicare Other

## 2022-04-08 VITALS — BP 101/72

## 2022-04-08 DIAGNOSIS — I10 Essential (primary) hypertension: Secondary | ICD-10-CM

## 2022-04-16 ENCOUNTER — Telehealth (INDEPENDENT_AMBULATORY_CARE_PROVIDER_SITE_OTHER): Payer: Self-pay

## 2022-04-16 NOTE — Telephone Encounter (Signed)
Contacted pt to go over lab results pt is aware and doesn't have any questions or concerns 

## 2022-05-18 ENCOUNTER — Ambulatory Visit (INDEPENDENT_AMBULATORY_CARE_PROVIDER_SITE_OTHER): Payer: Self-pay | Admitting: *Deleted

## 2022-05-18 ENCOUNTER — Ambulatory Visit: Payer: Medicare Other | Admitting: Physician Assistant

## 2022-05-18 VITALS — BP 132/77 | HR 85 | Ht 67.5 in | Wt 283.0 lb

## 2022-05-18 DIAGNOSIS — I10 Essential (primary) hypertension: Secondary | ICD-10-CM

## 2022-05-18 DIAGNOSIS — R7303 Prediabetes: Secondary | ICD-10-CM

## 2022-05-18 DIAGNOSIS — F4329 Adjustment disorder with other symptoms: Secondary | ICD-10-CM

## 2022-05-18 DIAGNOSIS — Z720 Tobacco use: Secondary | ICD-10-CM

## 2022-05-18 DIAGNOSIS — R42 Dizziness and giddiness: Secondary | ICD-10-CM | POA: Diagnosis not present

## 2022-05-18 DIAGNOSIS — G4700 Insomnia, unspecified: Secondary | ICD-10-CM

## 2022-05-18 DIAGNOSIS — F17210 Nicotine dependence, cigarettes, uncomplicated: Secondary | ICD-10-CM

## 2022-05-18 DIAGNOSIS — E66813 Obesity, class 3: Secondary | ICD-10-CM

## 2022-05-18 DIAGNOSIS — Z6841 Body Mass Index (BMI) 40.0 and over, adult: Secondary | ICD-10-CM

## 2022-05-18 LAB — GLUCOSE, POCT (MANUAL RESULT ENTRY): POC Glucose: 123 mg/dl — AB (ref 70–99)

## 2022-05-18 MED ORDER — MECLIZINE HCL 25 MG PO TABS: 25.0000 mg | ORAL_TABLET | Freq: Three times a day (TID) | ORAL | 0 refills | Status: AC | PRN

## 2022-05-18 NOTE — Progress Notes (Unsigned)
Established Patient Office Visit  Subjective   Patient ID: Sarah Sloan, female    DOB: 12/21/1955  Age: 66 y.o. MRN: [id number]  Chief Complaint  Patient presents with   Dizziness    Episodes, have been consistent for the last 6-7 months     States that she has been experiencing episodes of "mild dizziness" for last 6-7 months - but this morning was "really bad, room was spinning and she had nausea' states that she did not check her blood glucose levels during these episodes, states that her machine is "broken".  States that she has been trying to rest with some improvement, otherwise has not tried anything for relief.  States that she has not taken any medications today.  States that she is drinking approximately 3-4 bottles of water a day.  Continues to smoke a half a pack of cigarettes a day.  Does endorse difficulty falling asleep.  States that she will go to bed at approximately 530 6:00 PM and not be able to fall asleep until 10 or 11.  Does endorse watching TV and scrolling social media.  States that she has tried calming tea without relief.  Does endorse that she is sleeping approximately 8 hours a night.  Endorses continued grief from loss of husband in summer 2022.    Past Medical History:  Diagnosis Date   Hypertension    Rheumatoid arthritis (HCC)    Social History   Socioeconomic History   Marital status: Widowed    Spouse name: Not on file   Number of children: Not on file   Years of education: Not on file   Highest education level: Not on file  Occupational History   Not on file  Tobacco Use   Smoking status: Some Days    Packs/day: 0.25    Years: 49.00    Total pack years: 12.25    Types: Cigarettes   Smokeless tobacco: Never  Vaping Use   Vaping Use: Former  Substance and Sexual Activity   Alcohol use: Yes    Comment: occ   Drug use: Not Currently   Sexual activity: Not Currently  Other Topics Concern   Not on file  Social History  Narrative   Not on file   Social Determinants of Health   Financial Resource Strain: Not on file  Food Insecurity: Not on file  Transportation Needs: Not on file  Physical Activity: Not on file  Stress: Not on file  Social Connections: Not on file  Intimate Partner Violence: Not on file   Family History  Problem Relation Age of Onset   Rheum arthritis Mother    Rheum arthritis Sister    Rheum arthritis Brother    Breast cancer Maternal Grandmother 73   Allergies  Allergen Reactions   Penicillins Other (See Comments)    Causes yeast infections Has patient had a PCN reaction causing immediate rash, facial/tongue/throat swelling, SOB or lightheadedness with hypotension: no Has patient had a PCN reaction causing severe rash involving mucus membranes or skin necrosis: no Has patient had a PCN reaction that required hospitalization : no Has patient had a PCN reaction occurring within the last 10 years: yes If all of the above answers are "NO", then may proceed with Cephalosporin use.     Review of Systems  Constitutional:  Negative for chills and fever.  HENT:  Negative for hearing loss, sinus pain, sore throat and tinnitus.   Eyes:  Negative for blurred vision, double vision and  photophobia.  Respiratory:  Negative for shortness of breath.   Cardiovascular:  Negative for chest pain.  Gastrointestinal:  Positive for nausea. Negative for vomiting.  Genitourinary: Negative.   Musculoskeletal: Negative.   Skin: Negative.   Neurological:  Positive for dizziness. Negative for speech change, seizures, loss of consciousness and weakness.  Endo/Heme/Allergies: Negative.   Psychiatric/Behavioral:  Negative for depression. The patient has insomnia. The patient is not nervous/anxious.       Objective:     BP 132/77 (BP Location: Left Arm, Patient Position: Sitting, Cuff Size: Large)   Pulse 85   Ht 5' 7.5" (1.715 m)   Wt 283 lb (128.4 kg)   SpO2 97%   BMI 43.67 kg/m  BP  Readings from Last 3 Encounters:  05/18/22 132/77  04/08/22 101/72  03/26/22 (!) 165/90   Wt Readings from Last 3 Encounters:  05/18/22 283 lb (128.4 kg)  03/26/22 286 lb (129.7 kg)  08/31/18 256 lb 3.2 oz (116.2 kg)      Physical Exam Vitals and nursing note reviewed.  Constitutional:      General: She is not in acute distress.    Appearance: Normal appearance. She is obese.  HENT:     Head: Normocephalic and atraumatic.     Right Ear: Tympanic membrane, ear canal and external ear normal.     Left Ear: Tympanic membrane, ear canal and external ear normal.     Nose: Nose normal.     Mouth/Throat:     Mouth: Mucous membranes are moist.  Eyes:     Extraocular Movements: Extraocular movements intact.     Conjunctiva/sclera: Conjunctivae normal.     Pupils: Pupils are equal, round, and reactive to light.  Cardiovascular:     Rate and Rhythm: Normal rate and regular rhythm.     Pulses: Normal pulses.     Heart sounds: Normal heart sounds.  Pulmonary:     Effort: Pulmonary effort is normal.  Musculoskeletal:        General: Normal range of motion.     Cervical back: Normal range of motion and neck supple.  Skin:    General: Skin is warm and dry.  Neurological:     General: No focal deficit present.     Mental Status: She is alert and oriented to person, place, and time.     Cranial Nerves: No cranial nerve deficit.     Sensory: No sensory deficit.  Psychiatric:        Mood and Affect: Mood normal.        Behavior: Behavior normal.        Thought Content: Thought content normal.        Judgment: Judgment normal.         Assessment & Plan:   Problem List Items Addressed This Visit       Cardiovascular and Mediastinum   Essential hypertension     Other   Tobacco use   Class 3 severe obesity due to excess calories without serious comorbidity with body mass index (BMI) of 40.0 to 44.9 in adult [facility])   Insomnia   Prediabetes   Prolonged grief reaction   Other  Visit Diagnoses     Dizziness and giddiness    -  Primary   Relevant Medications   meclizine (ANTIVERT) 25 MG tablet   Other Relevant Orders   Basic metabolic panel (Completed)   Thyroid Panel With TSH (Completed)   POCT glucose (manual entry) (Completed)  1. Dizziness and giddiness Patient had labs completed recently, A1c 6.4.  Trial meclizine, patient education given on supportive care.  Red flags given for prompt reevaluation - Basic metabolic panel - Thyroid Panel With TSH - POCT glucose (manual entry) - meclizine (ANTIVERT) 25 MG tablet; Take 1 tablet (25 mg total) by mouth 3 (three) times daily as needed for dizziness.  Dispense: 30 tablet; Refill: 0  2. Prediabetes Patient encouraged to continue working on low sugar diet, check blood glucose at home, patient encouraged to bring blood glucose meter by mobile unit to help with repair.  3. Essential hypertension Continue checking blood pressure at home, keep a written log and have available for all office visits  4. Prolonged grief reaction    05/18/2022    4:54 PM 03/26/2022    9:17 AM 08/31/2018    3:00 PM 07/01/2018    3:08 PM 04/20/2018    3:29 PM  Depression screen PHQ 2/9  Decreased Interest 0 3 0 0 0  Down, Depressed, Hopeless 0 0 0 0 0  PHQ - 2 Score 0 3 0 0 0  Altered sleeping 1 3     Tired, decreased energy 1 3     Change in appetite 1      Feeling bad or failure about yourself   0     Trouble concentrating 1 1     Moving slowly or fidgety/restless 1 3     Suicidal thoughts 0 0     PHQ-9 Score 5 13        5. Insomnia, unspecified type Patient education given on good sleep hygiene.  6. Tobacco use   I have reviewed the patient's medical history (PMH, PSH, Social History, Family History, Medications, and allergies) , and have been updated if relevant. I spent 30 minutes reviewing chart and  face to face time with patient.    Return if symptoms worsen or fail to improve.    Loraine Grip Mayers,  PA-C

## 2022-05-18 NOTE — Telephone Encounter (Signed)
Summary: Dizziness/headaches   The patient called in stating she fell last Monday and she has been having headaches. She said this morning she was very dizzy and the room was spinning. She has an appointment in February is on the wait list. Please assist patient further           Chief Complaint: Dizziness Symptoms: "Spinning this AM"  now lightheaded. Had severe headache yesterday, now mild. States has had dizziness off and on for 8 months "Not as bad as this morning." States BP 145/84 this am, during call 150/88. Dizziness worsens with sitting to standing fast "Anytime I move fast." States she tripped and fell last Monday. Frequency: This AM Pertinent Negatives: Patient denies  Disposition: [] ED /[] Urgent Care (no appt availability in office) / [] Appointment(In office/virtual)/ []  [facility]/ [] Home Care/ [] Refused Recommended Disposition /[x] [facility]/ []  Follow-up with PCP Additional Notes: No availability at practice. Advised [facility] if has driver. States will get someone to take her. Care advise provided, pt verbalizes understanding.  Reason for Disposition  [1] MODERATE dizziness (e.g., vertigo; feels very unsteady, interferes with normal activities) AND [2] has NOT been evaluated by doctor (or NP/PA) for this  Answer Assessment - Initial Assessment Questions 1. DESCRIPTION: "Describe your dizziness."     Spinning this AM, now lightheaded. 2. VERTIGO: "Do you feel like either you or the room is spinning or tilting?"      Yes 3. LIGHTHEADED: "Do you feel lightheaded?" (e.g., somewhat faint, woozy, weak upon standing)     Yes 4. SEVERITY: "How bad is it?"  "Can you walk?"   - MILD: Feels slightly dizzy and unsteady, but is walking normally.   - MODERATE: Feels unsteady when walking, but not falling; interferes with normal activities (e.g., school, work).   - SEVERE: Unable to walk without falling, or requires assistance to walk without falling.      Moderate 5. ONSET:  "When did the dizziness begin?"     This AM 6. AGGRAVATING FACTORS: "Does anything make it worse?" (e.g., standing, change in head position)     Stand up too fast or do something quickly. 7. CAUSE: "What do you think is causing the dizziness?"     Unsure. BP this AM 145/84    then  125/70  150 /88 during call. 8. RECURRENT SYMPTOM: "Have you had dizziness before?" If Yes, ask: "When was the last time?" "What happened that time?"     Yes "On and off" for last 8 months 9. OTHER SYMPTOMS: "Do you have any other symptoms?" (e.g., headache, weakness, numbness, vomiting, earache)     Last night bad headache, now mild. Tripped and fell last Monday.  Protocols used: Dizziness - Vertigo-A-AH

## 2022-05-18 NOTE — Patient Instructions (Signed)
To help with your dizziness, you are going to use meclizine as directed.  I encourage you to make sure that you continue to stay well-hydrated, and work on improving your sleep patterns.  We will call you with today's lab results.  I do encourage you to check your blood pressure and blood sugars when you are having episodes of dizziness.  Please feel free to return to the mobile unit for [place] to check your blood glucose monitor.  Roney Jaffe, PA-C Physician Assistant [facility] [email or web address]   Dizziness Dizziness is a common problem. It is a feeling of unsteadiness or light-headedness. You may feel like you are about to faint. Dizziness can lead to injury if you stumble or fall. Anyone can become dizzy, but dizziness is more common in older adults. This condition can be caused by a number of things, including medicines, dehydration, or illness. Follow these instructions at home: Eating and drinking  Drink enough fluid to keep your urine pale yellow. This helps to keep you from becoming dehydrated. Try to drink more clear fluids, such as water. Do not drink alcohol. Limit your caffeine intake if told to do so by your health care provider. Check ingredients and nutrition facts to see if a food or beverage contains caffeine. Limit your salt (sodium) intake if told to do so by your health care provider. Check ingredients and nutrition facts to see if a food or beverage contains sodium. Activity  Avoid making quick movements. Rise slowly from chairs and steady yourself until you feel okay. In the morning, first sit up on the side of the bed. When you feel okay, stand slowly while you hold onto something until you know that your balance is good. If you need to stand in one place for a long time, move your legs often. Tighten and relax the muscles in your legs while you are standing. Do not drive or use machinery if you feel  dizzy. Avoid bending down if you feel dizzy. Place items in your home so that they are easy for you to reach without leaning over. Lifestyle Do not use any products that contain nicotine or tobacco. These products include cigarettes, chewing tobacco, and vaping devices, such as e-cigarettes. If you need help quitting, ask your health care provider. Try to reduce your stress level by using methods such as yoga or meditation. Talk with your health care provider if you need help to manage your stress. General instructions Watch your dizziness for any changes. Take over-the-counter and prescription medicines only as told by your health care provider. Talk with your health care provider if you think that your dizziness is caused by a medicine that you are taking. Tell a friend or a family member that you are feeling dizzy. If he or she notices any changes in your behavior, have this person call your health care provider. Keep all follow-up visits. This is important. Contact a health care provider if: Your dizziness does not go away or you have new symptoms. Your dizziness or light-headedness gets worse. You feel nauseous. You have reduced hearing. You have a fever. You have neck pain or a stiff neck. Your dizziness leads to an injury or a fall. Get help right away if: You vomit or have diarrhea and are unable to eat or drink anything. You have problems talking, walking, swallowing, or using your arms, hands, or legs. You feel generally weak. You have any bleeding. You are not thinking clearly or you have trouble  forming sentences. It may take a friend or family member to notice this. You have chest pain, abdominal pain, shortness of breath, or sweating. Your vision changes or you develop a severe headache. These symptoms may represent a serious problem that is an emergency. Do not wait to see if the symptoms will go away. Get medical help right away. Call your local emergency services (911 in  the [place] Do not drive yourself to the hospital. Summary Dizziness is a feeling of unsteadiness or light-headedness. This condition can be caused by a number of things, including medicines, dehydration, or illness. Anyone can become dizzy, but dizziness is more common in older adults. Drink enough fluid to keep your urine pale yellow. Do not drink alcohol. Avoid making quick movements if you feel dizzy. Monitor your dizziness for any changes. This information is not intended to replace advice given to you by your health care provider. Make sure you discuss any questions you have with your health care provider. Document Revised: 04/22/2020 Document Reviewed: 04/22/2020 Elsevier Patient Education  2023 [employer].

## 2022-05-19 ENCOUNTER — Encounter: Payer: Self-pay | Admitting: Physician Assistant

## 2022-05-19 DIAGNOSIS — R7303 Prediabetes: Secondary | ICD-10-CM | POA: Insufficient documentation

## 2022-05-19 DIAGNOSIS — F4329 Adjustment disorder with other symptoms: Secondary | ICD-10-CM | POA: Insufficient documentation

## 2022-05-19 LAB — THYROID PANEL WITH TSH
Free Thyroxine Index: 1.6 (ref 1.2–4.9)
T3 Uptake Ratio: 24 % (ref 24–39)
T4, Total: 6.7 ug/dL (ref 4.5–12.0)
TSH: 2.01 u[IU]/mL (ref 0.450–4.500)

## 2022-05-19 LAB — BASIC METABOLIC PANEL
BUN/Creatinine Ratio: 15 (ref 12–28)
BUN: 11 mg/dL (ref 8–27)
CO2: 22 mmol/L (ref 20–29)
Calcium: 9.4 mg/dL (ref 8.7–10.3)
Chloride: 98 mmol/L (ref 96–106)
Creatinine, Ser: 0.75 mg/dL (ref 0.57–1.00)
Glucose: 119 mg/dL — ABNORMAL HIGH (ref 70–99)
Potassium: 3.7 mmol/L (ref 3.5–5.2)
Sodium: 138 mmol/L (ref 134–144)
eGFR: 88 mL/min/{1.73_m2} (ref >59)

## 2022-05-20 ENCOUNTER — Telehealth: Payer: Self-pay

## 2022-05-20 ENCOUNTER — Other Ambulatory Visit: Payer: Self-pay | Admitting: Physician Assistant

## 2022-05-20 DIAGNOSIS — R7303 Prediabetes: Secondary | ICD-10-CM

## 2022-05-20 MED ORDER — ACCU-CHEK GUIDE VI STRP: 1.0000 | ORAL_STRIP | Freq: Two times a day (BID) | 12 refills | Status: AC

## 2022-05-20 MED ORDER — ACCU-CHEK AVIVA PLUS W/DEVICE KIT: 1.0000 [IU] | PACK | Freq: Once | 0 refills | Status: AC

## 2022-05-20 MED ORDER — ACCU-CHEK SOFTCLIX LANCETS MISC: 12 refills | Status: AC

## 2022-05-20 NOTE — Telephone Encounter (Signed)
Ms.Osterlund Called [facility] on 12.19.23 approximately around 6:30 pm stating she couldn't find her Bp, or Cholesterol medication. Patient is currently taking Valsartan-Hydrochlorothizide 160-25 mg for Blood pressure management and Atrovastatin 40mg  for cholesterol management. A follow up appointment was offered with [name], PA for next day to discuss medication refill options.At the time, Patient declined stating she will continue to look, due to not having the finances to cover another prescription. I offered to call patient in the Morning to follow up with her. Patient agreed and call was disconnected.     Spoke with Ms. Schulke this Morning, she states she did locate her medications. Patient is requesting for a Rx to be sent to her pharmacy for a new glucose meter, she states she is still experiencing dizziness, which has limited her ability to drive. Concerns were expressed to [place], [place] for further evaluation.

## 2022-05-21 NOTE — Telephone Encounter (Signed)
Followed up with Sarah Sloan to inform her that a Rx has been sent to her desired pharmacy for a new Glucose Meter. [name], Sarah Sloan has encouraged Sarah Sloan to continue working on the discussed plan regarding her dizziness. Sarah Sloan is aware to reach out to the [facility] if no improvement.

## 2022-05-21 NOTE — Telephone Encounter (Signed)
Sending to PCP as FYI.

## 2022-07-17 ENCOUNTER — Ambulatory Visit (INDEPENDENT_AMBULATORY_CARE_PROVIDER_SITE_OTHER): Payer: Medicare Other | Admitting: Primary Care

## 2022-07-27 ENCOUNTER — Telehealth: Payer: Self-pay | Admitting: Emergency Medicine

## 2022-07-27 ENCOUNTER — Ambulatory Visit
Admission: RE | Admit: 2022-07-27 | Discharge: 2022-07-27 | Disposition: A | Discharge status: Home / Self Care | Payer: Medicare Other | Source: Ambulatory Visit | Attending: Primary Care | Admitting: Primary Care

## 2022-07-27 DIAGNOSIS — E2839 Other primary ovarian failure: Secondary | ICD-10-CM

## 2022-07-27 NOTE — Telephone Encounter (Signed)
Copied from [place] [id number]. Topic: Referral - Request for Referral >> Jul 27, 2022  4:18 PM Dominique A wrote: Pt is needing a referral for a mammogram. Pt states she is having pain in both breast and pain in her right nipple. Pt would like to go to [facility].  [address], [place], [place] [address]  Please advise

## 2022-07-30 ENCOUNTER — Other Ambulatory Visit (INDEPENDENT_AMBULATORY_CARE_PROVIDER_SITE_OTHER): Payer: Self-pay | Admitting: Primary Care

## 2022-07-30 DIAGNOSIS — N644 Mastodynia: Secondary | ICD-10-CM

## 2022-07-30 NOTE — Telephone Encounter (Signed)
Will forward to provider  

## 2022-07-30 NOTE — Telephone Encounter (Signed)
Returned pt call and made aware that order has been placed and she can contact [facility] to schedule. Pt doesn't have any questions or concerns

## 2022-07-30 NOTE — Telephone Encounter (Signed)
done 

## 2022-07-31 ENCOUNTER — Telehealth: Payer: Self-pay | Admitting: Emergency Medicine

## 2022-07-31 NOTE — Telephone Encounter (Signed)
Copied from [place] [id number]. Topic: General - Other >> Jul 31, 2022  3:17 PM Ja-Kwan M wrote: Reason for CRM: Pt reports that she was suppose to come in today for a Vitamin D test so she wanted to let the office know that she is on her way

## 2022-08-03 ENCOUNTER — Other Ambulatory Visit (INDEPENDENT_AMBULATORY_CARE_PROVIDER_SITE_OTHER): Payer: Self-pay | Admitting: Primary Care

## 2022-08-03 ENCOUNTER — Other Ambulatory Visit (INDEPENDENT_AMBULATORY_CARE_PROVIDER_SITE_OTHER): Payer: Medicare Other

## 2022-08-03 DIAGNOSIS — M810 Age-related osteoporosis without current pathological fracture: Secondary | ICD-10-CM

## 2022-08-04 LAB — VITAMIN D 25 HYDROXY (VIT D DEFICIENCY, FRACTURES): Vit D, 25-Hydroxy: 22.7 ng/mL — ABNORMAL LOW (ref 30.0–100.0)

## 2022-08-06 ENCOUNTER — Other Ambulatory Visit (INDEPENDENT_AMBULATORY_CARE_PROVIDER_SITE_OTHER): Payer: Self-pay | Admitting: Primary Care

## 2022-08-06 MED ORDER — ALENDRONATE SODIUM 70 MG PO TABS: 70.0000 mg | ORAL_TABLET | ORAL | 11 refills | Status: AC

## 2022-08-17 ENCOUNTER — Ambulatory Visit: Admission: RE | Admit: 2022-08-17 | Payer: Medicare Other | Source: Ambulatory Visit

## 2022-08-17 ENCOUNTER — Ambulatory Visit
Admission: RE | Admit: 2022-08-17 | Discharge: 2022-08-17 | Disposition: A | Discharge status: Home / Self Care | Payer: Medicare Other | Source: Ambulatory Visit | Attending: Primary Care | Admitting: Primary Care

## 2022-08-17 DIAGNOSIS — N644 Mastodynia: Secondary | ICD-10-CM

## 2022-08-18 ENCOUNTER — Ambulatory Visit (INDEPENDENT_AMBULATORY_CARE_PROVIDER_SITE_OTHER): Payer: Medicare Other | Admitting: Primary Care

## 2022-08-18 ENCOUNTER — Encounter (INDEPENDENT_AMBULATORY_CARE_PROVIDER_SITE_OTHER): Payer: Self-pay | Admitting: Primary Care

## 2022-08-18 VITALS — BP 108/72 | HR 83 | Resp 16 | Wt 283.0 lb

## 2022-08-18 DIAGNOSIS — M810 Age-related osteoporosis without current pathological fracture: Secondary | ICD-10-CM

## 2022-08-18 NOTE — Patient Instructions (Signed)
Pneumococcal Conjugate Vaccine: What You Need to Know 1. Why get vaccinated? Pneumococcal conjugate vaccine can prevent pneumococcal disease. Pneumococcal disease refers to any illness caused by pneumococcal bacteria. These bacteria can cause many types of illnesses, including pneumonia, which is an infection of the lungs. Pneumococcal bacteria are one of the most common causes of pneumonia. Besides pneumonia, pneumococcal bacteria can also cause: Ear infections Sinus infections Meningitis (infection of the tissue covering the brain and spinal cord) Bacteremia (infection of the blood) Anyone can get pneumococcal disease, but children under 2 years old, people with certain medical conditions or other risk factors, and adults 65 years or older are at the highest risk. Most pneumococcal infections are mild. However, some can result in long-term problems, such as brain damage or hearing loss. Meningitis, bacteremia, and pneumonia caused by pneumococcal disease can be fatal. 2. Pneumococcal conjugate vaccine Pneumococcal conjugate vaccine helps protect against bacteria that cause pneumococcal disease. There are three pneumococcal conjugate vaccines (PCV13, PCV15, and PCV20). The different vaccines are recommended for different people based on age and medical status. Your health care provider can help you determine which type of pneumococcal conjugate vaccine, and how many doses, you should receive. Infants and young children usually need 4 doses of pneumococcal conjugate vaccine. These doses are recommended at 2, 4, 6, and 12-15 months of age. Older children and adolescents might need pneumococcal conjugate vaccine depending on their age and medical conditions or other risk factors if they did not receive the recommended doses as infants or young children. Adults 19 through 64 years old with certain medical conditions or other risk factors who have not already received pneumococcal conjugate vaccine should  receive pneumococcal conjugate vaccine. Adults 65 years or older who have not previously received pneumococcal conjugate vaccine should receive pneumococcal conjugate vaccine. Some people with certain medical conditions are also recommended to receive pneumococcal polysaccharide vaccine (a different type of pneumococcal vaccine known as PPSV23). Some adults who have previously received a pneumococcal conjugate vaccine may be recommended to receive another pneumococcal conjugate vaccine. 3. Talk with your health care provider Tell your vaccination provider if the person getting the vaccine: Has had an allergic reaction after a previous dose of any type of pneumococcal conjugate vaccine (PCV13, PCV15, PCV20, or an earlier pneumococcal conjugate vaccine known as PCV7), or to any vaccine containing diphtheria toxoid (for example, DTaP), or has any severe, life-threatening allergies In some cases, your health care provider may decide to postpone pneumococcal conjugate vaccination until a future visit. People with minor illnesses, such as a cold, may be vaccinated. People who are moderately or severely ill should usually wait until they recover. Your health care provider can give you more information. 4. Risks of a vaccine reaction Redness, swelling, pain, or tenderness where the shot is given, and fever, loss of appetite, fussiness (irritability), feeling tired, headache, muscle aches, joint pain, and chills can happen after pneumococcal conjugate vaccination. Young children may be at increased risk for seizures caused by fever after a pneumococcal conjugate vaccine if it is administered at the same time as inactivated influenza vaccine. Ask your health care provider for more information. People sometimes faint after medical procedures, including vaccination. Tell your provider if you feel dizzy or have vision changes or ringing in the ears. As with any medicine, there is a very remote chance of a vaccine  causing a severe allergic reaction, other serious injury, or death. 5. What if there is a serious problem? An allergic reaction could occur after   the vaccinated person leaves the clinic. If you see signs of a severe allergic reaction (hives, swelling of the face and throat, difficulty breathing, a fast heartbeat, dizziness, or weakness), call 9-1-1 and get the person to the nearest hospital. For other signs that concern you, call your health care provider. Adverse reactions should be reported to the Vaccine Adverse Event Reporting System (VAERS). Your health care provider will usually file this report, or you can do it yourself. Visit the VAERS website at www.vaers.hhs.gov or call 1-800-822-7967. VAERS is only for reporting reactions, and VAERS staff members do not give medical advice. 6. The National Vaccine Injury Compensation Program The National Vaccine Injury Compensation Program (VICP) is a federal program that was created to compensate people who may have been injured by certain vaccines. Claims regarding alleged injury or death due to vaccination have a time limit for filing, which may be as short as two years. Visit the VICP website at www.hrsa.gov/vaccinecompensation or call 1-800-338-2382 to learn about the program and about filing a claim. 7. How can I learn more? Ask your health care provider. Call your local or state health department. Visit the website of the Food and Drug Administration (FDA) for vaccine package inserts and additional information at www.fda.gov/vaccines-blood-biologics/vaccines. Contact the Centers for Disease Control and Prevention (CDC): Call 1-800-232-4636 (1-800-CDC-INFO) or Visit CDC's website at www.cdc.gov/vaccines. Source: CDC Vaccine Information Statement (Interim) Pneumococcal Conjugate Vaccine (10/10/2021) This same material is available at www.cdc.gov for no charge. This information is not intended to replace advice given to you by your health care  provider. Make sure you discuss any questions you have with your health care provider. Document Revised: 11/28/2021 Document Reviewed: 11/28/2021 Elsevier Patient Education  2023 Elsevier Inc.  

## 2022-08-18 NOTE — Progress Notes (Signed)
[place], is a 67 y.o. female  [id number]  [id number]  DOB - November 28, 1955  Chief Complaint  Patient presents with   Follow-up       Subjective:   Ms.Lucerito Mcglocklin is a 67 y.o. female here today for a follow up visit after starting her Fosamax 70mg  weekly tolerability. She states she never received it [facility] did not received the prescription. Called the pharmacy stated dated sent 08/06/22 and received on there end 3:34. Pharmacy is currently trying to figure out what happen.Pharmacy called patient while at her visit and told her she had medication to pick up while leaving PCP on hold. Patient has No headache, No chest pain, No abdominal pain - No Nausea, No new weakness tingling or numbness, No Cough - shortness of breath  No problems updated.  Allergies  Allergen Reactions   Penicillins Other (See Comments)    Causes yeast infections Has patient had a PCN reaction causing immediate rash, facial/tongue/throat swelling, SOB or lightheadedness with hypotension: no Has patient had a PCN reaction causing severe rash involving mucus membranes or skin necrosis: no Has patient had a PCN reaction that required hospitalization : no Has patient had a PCN reaction occurring within the last 10 years: yes If all of the above answers are "NO", then may proceed with Cephalosporin use.     Past Medical History:  Diagnosis Date   Hypertension    Rheumatoid arthritis ([place])     Current Outpatient Medications on File Prior to Visit  Medication Sig Dispense Refill   Accu-Chek Softclix Lancets lancets Use as instructed 100 each 12   glucose blood (ACCU-CHEK GUIDE) test strip 1 each by Other route 2 (two) times daily. Use as instructed 100 each 12   albuterol (PROVENTIL HFA;VENTOLIN HFA) 108 (90 Base) MCG/ACT inhaler INHALE 2 PUFF BY MOUTH EVERY 4 TO 6 HOURS AS NEEDED (Patient not taking: Reported on 03/26/2022) 3 Inhaler 2   alendronate (FOSAMAX) 70 MG  tablet Take 1 tablet (70 mg total) by mouth every 7 (seven) days. Take with a full glass of water on an empty stomach. 4 tablet 11   aspirin 81 MG chewable tablet Chew by mouth daily.     atorvastatin (LIPITOR) 40 MG tablet Take 1 tablet (40 mg total) by mouth daily. 90 tablet 3   B-D ULTRAFINE III SHORT PEN 31G X 8 MM MISC USE AS DIRECTED WITH SAXENDA (Patient not taking: Reported on 03/26/2022) 100 each 2   Cholecalciferol (VITAMIN D PO) Take 1 tablet by mouth once a week.  (Patient not taking: Reported on 03/26/2022)     diclofenac sodium (VOLTAREN) 1 % GEL Apply 3 grams to 3 large joints up to 3 times daily (Patient not taking: Reported on 03/26/2022) 3 Tube 3   doxycycline (VIBRA-TABS) 100 MG tablet Take 1 tablet (100 mg total) by mouth 2 (two) times daily. (Patient not taking: Reported on 08/31/2018) 14 tablet 0   GARLIC-PARSLEY PO Take 1 tablet by mouth daily. (Patient not taking: Reported on 03/26/2022)     ibuprofen (ADVIL,MOTRIN) 600 MG tablet Take 1 tablet (600 mg total) by mouth every 6 (six) hours as needed. (Patient not taking: Reported on 08/31/2018) 30 tablet 0   lidocaine 4 % Place 1 patch onto the skin daily. (Patient not taking: Reported on 05/18/2022) 60 patch 1   Liraglutide -Weight Management (SAXENDA) 18 MG/3ML SOPN Inject 3 mg into the skin daily. (Patient not taking: Reported on 03/26/2022) 5 pen 1  meclizine (ANTIVERT) 25 MG tablet Take 1 tablet (25 mg total) by mouth 3 (three) times daily as needed for dizziness. 30 tablet 0   Multiple Vitamins-Minerals (MULTIVITAMIN WITH MINERALS) tablet Take 1 tablet by mouth daily.     SYMBICORT 160-4.5 MCG/ACT inhaler INHALE 2 PUFFS BY MOUTH TWICE A DAY ( IN THE MORNING AND EVENING ) (Patient not taking: Reported on 03/26/2022) 3 Inhaler 1   valsartan-hydrochlorothiazide (DIOVAN-HCT) 160-25 MG tablet Take 1 tablet by mouth daily. 90 tablet 3   No current facility-administered medications on file prior to visit.    Objective:   Vitals:    08/18/22 1048  BP: 108/72  Pulse: 83  Resp: 16  SpO2: 98%  Weight: 283 lb (128.4 kg)    Comprehensive ROS Pertinent positive and negative noted in HPI   Exam General appearance : Awake, alert, not in any distress. Speech Clear. Not toxic looking HEENT: Atraumatic and Normocephalic, pupils equally reactive to light and accomodation Neck: Supple, no JVD. No cervical lymphadenopathy.  Chest: Good air entry bilaterally, no added sounds  CVS: S1 S2 regular, no murmurs.  Abdomen: Bowel sounds present, Non tender and not distended with no gaurding, rigidity or rebound. Extremities: B/L Lower Ext shows no edema, both legs are warm to touch Neurology: Awake alert, and oriented X 3,Non focal Skin: No Rash  Data Review Lab Results  Component Value Date   HGBA1C 6.4 (H) 03/26/2022    Assessment & Plan  [place] was seen today for follow-up.  Diagnoses and all orders for this visit:  Age-related osteoporosis without current pathological fracture Pharmacy hung up on PCP. Patient will pick up meds today    Patient have been counseled extensively about nutrition and exercise. Other issues discussed during this visit include: low cholesterol diet, weight control and daily exercise, foot care, annual eye examinations at Ophthalmology, importance of adherence with medications and regular follow-up. We also discussed long term complications of uncontrolled diabetes and hypertension.   Return in about 4 weeks (around 09/15/2022) for vaccine and med f/u.  The patient was given clear instructions to go to ER or return to medical center if symptoms don't improve, worsen or new problems develop. The patient verbalized understanding. The patient was told to call to get lab results if they haven't heard anything in the next week.   This note has been created with Surveyor, quantity. Any transcriptional errors are unintentional.   Kerin Perna,  NP 08/23/2022, 8:28 PM

## 2022-09-18 ENCOUNTER — Ambulatory Visit (INDEPENDENT_AMBULATORY_CARE_PROVIDER_SITE_OTHER): Payer: Medicare Other | Admitting: Primary Care

## 2022-10-07 ENCOUNTER — Telehealth: Payer: Self-pay | Admitting: Primary Care

## 2022-10-07 NOTE — Telephone Encounter (Signed)
Form has been faxed back.

## 2022-10-07 NOTE — Telephone Encounter (Signed)
Copied from CRM [id number]. Topic: General - Other >> Oct 07, 2022 12:54 PM Carrielelia G wrote: Reason for CRM: Clarification of diagnosis codes needed for 06-01-22 visit per Sasha at [facility]  ref# [id number]

## 2022-10-08 ENCOUNTER — Telehealth (INDEPENDENT_AMBULATORY_CARE_PROVIDER_SITE_OTHER): Payer: Self-pay | Admitting: Primary Care

## 2022-10-08 NOTE — Telephone Encounter (Signed)
Contacted Georgeanna Lea Kellison to schedule their annual wellness visit. Patient declined to schedule AWV at this time.  MO LONGER A PATIENT   Patient stated she is seeing a new provider  Thank you,  Judeth Cornfield,  AMB Clinical Support [facility] AWV Program Direct Dial ??[phone]

## 2022-10-15 ENCOUNTER — Telehealth: Payer: Self-pay | Admitting: Primary Care

## 2022-10-15 NOTE — Telephone Encounter (Signed)
Opened in error

## 2023-02-22 ENCOUNTER — Other Ambulatory Visit: Payer: Self-pay | Admitting: Family Medicine

## 2023-02-22 ENCOUNTER — Ambulatory Visit
Admission: RE | Admit: 2023-02-22 | Discharge: 2023-02-22 | Disposition: A | Discharge status: Home / Self Care | Payer: Medicare Other | Source: Ambulatory Visit | Attending: Family Medicine | Admitting: Family Medicine

## 2023-02-22 DIAGNOSIS — M549 Dorsalgia, unspecified: Secondary | ICD-10-CM

## 2023-02-24 ENCOUNTER — Other Ambulatory Visit: Payer: Self-pay | Admitting: Family Medicine

## 2023-02-24 DIAGNOSIS — J449 Chronic obstructive pulmonary disease, unspecified: Secondary | ICD-10-CM

## 2023-04-13 ENCOUNTER — Emergency Department (HOSPITAL_COMMUNITY)
Admission: EM | Admit: 2023-04-13 | Discharge: 2023-04-14 | Disposition: A | Discharge status: Home / Self Care | Payer: Medicare Other | Attending: Emergency Medicine | Admitting: Emergency Medicine

## 2023-04-13 ENCOUNTER — Encounter (HOSPITAL_COMMUNITY): Payer: Self-pay

## 2023-04-13 DIAGNOSIS — M545 Low back pain, unspecified: Secondary | ICD-10-CM | POA: Diagnosis present

## 2023-04-13 DIAGNOSIS — M5442 Lumbago with sciatica, left side: Secondary | ICD-10-CM | POA: Insufficient documentation

## 2023-04-13 NOTE — ED Triage Notes (Signed)
Pt is coming from home has had lower back pain that starts in her back and radiates down her left leg. She states it happened after moving a couch on Sunday. She has tried OTC pain medication and heating pads and nothing is working. Pt has been able to stand pivot and move some but only minimal walking. She has no other complaints at this time and otherwise stable.  Medic vitals   142/90 80hr 100%ra 18rr 146bgl  18g left ac

## 2023-04-14 MED ORDER — PREDNISONE 20 MG PO TABS
60.0000 mg | ORAL_TABLET | Freq: Once | ORAL | Status: AC
  Administered 2023-04-14: 60 mg via ORAL
  Filled 2023-04-14: qty 3

## 2023-04-14 MED ORDER — PREDNISONE 20 MG PO TABS: 40.0000 mg | ORAL_TABLET | Freq: Every day | ORAL | 0 refills | Status: AC

## 2023-04-14 MED ORDER — LIDOCAINE 5 % EX PTCH: 1.0000 | MEDICATED_PATCH | CUTANEOUS | 0 refills | Status: AC

## 2023-04-14 MED ORDER — LIDOCAINE 5 % EX PTCH
1.0000 | MEDICATED_PATCH | CUTANEOUS | Status: DC
  Administered 2023-04-14: 1 via TRANSDERMAL
  Filled 2023-04-14: qty 1

## 2023-04-14 MED ORDER — CYCLOBENZAPRINE HCL 10 MG PO TABS: 10.0000 mg | ORAL_TABLET | Freq: Three times a day (TID) | ORAL | 0 refills | Status: AC | PRN

## 2023-04-14 NOTE — ED Provider Notes (Signed)
[facility] Emergency Department Provider Note MRN:  [id number]  Arrival date & time: 04/14/23     Chief Complaint   Back Pain   History of Present Illness   Sarah Sloan is a 67 y.o. year-old female presents to the ED with chief complaint of low back pain.  She states that onset of symptoms was after attempting to move her couch a couple of days ago.  She states that the pain radiates down her left leg.  She denies any bowel or bladder incontinence.  Denies fevers or chills.  She states the pain is worsened with movement and palpation.  She denies any successful treatments prior to arrival.  Hx of degenerative disc disease  History provided by patient.   Review of Systems  Pertinent positive and negative review of systems noted in HPI.    Physical Exam   Vitals:   04/13/23 2356 04/14/23 0322  BP: 110/62 (!) 115/54  Pulse: 91 71  Resp: 16 18  Temp: 98.4 F (36.9 C) 97.7 F (36.5 C)  SpO2: 98% 96%    CONSTITUTIONAL:  non toxic-appearing, NAD NEURO:  Alert and oriented x 3, CN 3-12 grossly intact EYES:  eyes equal and reactive ENT/NECK:  Supple, no stridor  CARDIO:  appears well-perfused, intact distal pulses PULM:  No respiratory distress,  GI/GU:  non-distended,  MSK/SPINE:  No gross deformities, no edema, moves all extremities, mild lumbar paraspinal muscle tenderness, normal dorsiflexion and plantar flexion SKIN:  no rash, atraumatic   *Additional and/or pertinent findings included in MDM below  Diagnostic and Interventional Summary    EKG Interpretation Date/Time:    Ventricular Rate:    PR Interval:    QRS Duration:    QT Interval:    QTC Calculation:   R Axis:      Text Interpretation:         Labs Reviewed - No data to display  No orders to display    Medications  lidocaine (LIDODERM) 5 % 1 patch (1 patch Transdermal Patch Applied 04/14/23 0316)  predniSONE (DELTASONE) tablet 60 mg (60 mg Oral Given 04/14/23 0314)      Procedures  /  Critical Care Procedures  ED Course and Medical Decision Making  I have reviewed the triage vital signs, the nursing notes, and pertinent available records from the EMR.  Social Determinants Affecting Complexity of Care: Patient has no clinically significant social determinants affecting this chief complaint..   ED Course:    Medical Decision Making Patient with back pain.    No neurological deficits and normal neuro exam.  Patient is ambulatory.  No loss of bowel or bladder control.  Doubt cauda equina.  Denies fever,  doubt epidural abscess or other lesion. Recommend back exercises, stretching, RICE, and will treat with a short course of prednisone and flexeril.   Review of prior charts show recent visit for back pain with l-spine x-ray showing degenerative disc disease.  Encouraged the patient that there could be a need for additional workup and/or imaging such as MRI, if the symptoms do not resolve. Patient advised that if the back pain does not resolve, or radiates, this could progress to more serious conditions and is encouraged to follow-up with PCP or orthopedics within 2 weeks.     Risk Prescription drug management.         Consultants: No consultations were needed in caring for this patient.   Treatment and Plan: Emergency department workup does not suggest an emergent  condition requiring admission or immediate intervention beyond  what has been performed at this time. The patient is safe for discharge and has  been instructed to return immediately for worsening symptoms, change in  symptoms or any other concerns    Final Clinical Impressions(s) / ED Diagnoses     ICD-10-CM   1. Acute left-sided low back pain with left-sided sciatica  M54.42       ED Discharge Orders          Ordered    predniSONE (DELTASONE) 20 MG tablet  Daily        04/14/23 0312    cyclobenzaprine (FLEXERIL) 10 MG tablet  3 times daily PRN        04/14/23 0312     lidocaine (LIDODERM) 5 %  Every 24 hours        04/14/23 4742              Discharge Instructions Discussed with and Provided to Patient:   Discharge Instructions   None      Roxy Horseman, PA-C 04/14/23 0415    Mesner, Barbara Cower, MD 04/15/23 [phone]

## 2023-04-21 ENCOUNTER — Encounter: Payer: Self-pay | Admitting: Physical Medicine and Rehabilitation

## 2023-04-21 ENCOUNTER — Ambulatory Visit (INDEPENDENT_AMBULATORY_CARE_PROVIDER_SITE_OTHER): Payer: Medicare Other | Admitting: Physical Medicine and Rehabilitation

## 2023-04-21 VITALS — BP 108/75 | HR 103

## 2023-04-21 DIAGNOSIS — M5441 Lumbago with sciatica, right side: Secondary | ICD-10-CM | POA: Diagnosis not present

## 2023-04-21 DIAGNOSIS — M5442 Lumbago with sciatica, left side: Secondary | ICD-10-CM | POA: Diagnosis not present

## 2023-04-21 DIAGNOSIS — M47816 Spondylosis without myelopathy or radiculopathy, lumbar region: Secondary | ICD-10-CM | POA: Diagnosis not present

## 2023-04-21 DIAGNOSIS — M5416 Radiculopathy, lumbar region: Secondary | ICD-10-CM | POA: Diagnosis not present

## 2023-04-21 DIAGNOSIS — M7918 Myalgia, other site: Secondary | ICD-10-CM

## 2023-04-21 DIAGNOSIS — G8929 Other chronic pain: Secondary | ICD-10-CM | POA: Diagnosis not present

## 2023-04-21 MED ORDER — TRAMADOL HCL 50 MG PO TABS: 50.0000 mg | ORAL_TABLET | Freq: Three times a day (TID) | ORAL | 0 refills | Status: AC | PRN

## 2023-04-21 NOTE — Progress Notes (Signed)
Functional Pain Scale - descriptive words and definitions  Immobilizing (10)   Unable to move or talk due to intensity of pain/unable to sleep and unable to use distraction. Severe range order  Average Pain 10   +Driver, -BT, -Dye Allergies.   Has had low back pain for years, but has been "really bad" since last week. That pain started after moving a couch to do some house cleaning. Hurts with getting up and down and getting out of bed. Hurts to get in and out of the car. Last week, she had stabbing/burning pain in her lower back. This week her legs "feel like lead." Pain radiates into both buttocks and down the posterior legs to the feet. Taking Meloxicam 7.5 mg -- no help. Walking with a rollator since last week.

## 2023-04-21 NOTE — Progress Notes (Signed)
Sarah Sloan - 67 y.o. female MRN [id number]  Date of birth: 08/25/1955  Office Visit Note: Visit Date: 04/21/2023 PCP: Pcp, No Referred by: No ref. provider found  Subjective: Chief Complaint  Patient presents with   Lower Back - Pain   HPI: Sarah Sloan is a 67 y.o. female who comes in today as a self referral for evaluation of acute on chronic bilateral lower back pain radiating to buttocks, hips and down posterolateral legs. Also reports numbness/tingling to bilateral feet. She reports chronic lower back pain worsened on 04/11/2023 after moving her couch. Her pain becomes worse with movement and activity, describes as sore and aching sensation, currently rates as 10 out of 10. Some relief of pain with home exercise regimen, rest and use of medications. She was evaluated in the emergency department on 04/14/2023, recent lumbar radiographs exhibits grade 1 retrolisthesis, disc space narrowing and facet joint arthropathy at L4-L5 and L5-S1. She was discharged with prescriptions for Lidocaine patches, oral Prednisone and Flexeril, some relief of pain with these medications. Lumbar MRI imaging from 2014 exhibits prominent bilateral facet arthropathy at L4-L5 and L5-S1. States she was evaluated by spine surgeon in 2008 whom recommended surgery, she did not proceed with surgical intervention at that time. Patient is currently walking with rolling walker, denies focal weakness, no recent trauma or falls.   Patients course is complicated by morbid obesity, tobacco abuse and prediabetes.      Review of Systems  Musculoskeletal:  Positive for back pain.  Neurological:  Positive for tingling. Negative for focal weakness and weakness.  All other systems reviewed and are negative.  Otherwise per HPI.  Assessment & Plan: Visit Diagnoses:    ICD-10-CM   1. Chronic bilateral low back pain with bilateral sciatica  M54.42 MR LUMBAR SPINE WO CONTRAST   M54.41    G89.29     2. Lumbar  radiculopathy  M54.16 MR LUMBAR SPINE WO CONTRAST    3. Facet arthropathy, lumbar  M47.816 MR LUMBAR SPINE WO CONTRAST    4. Myofascial pain syndrome  M79.18 MR LUMBAR SPINE WO CONTRAST       Plan: Findings:  Chronic, worsening and severe bilateral lower back pain radiating to buttocks, hips and down posterolateral legs. Patient continues to have severe pain despite good conservative therapies such as home exercise regimen, rest and use of medications. Patients clinical presentation and exam are consistent with L5 and S1 dermatomes. I also feel there is a myofascial component contributing to her pain as she has severe tenderness to bilateral lumbar paraspinal regions on exam today. Her exam today was difficult as she exhibits decreased effort due to severe pain. We discussed treatment plan in detail today, next step is to obtain lumbar MRI imaging. Depending on results of MRI imaging we discussed possibility of performing lumbar epidural steroid injections. Patient would like to try injection therapy before considering surgery. I also discussed medication management and prescribed short course of Tramadol. She has no questions regarding plan. I will see her back for lumbar MRI review.     Meds & Orders:  Meds ordered this encounter  Medications   traMADol (ULTRAM) 50 MG tablet    Sig: Take 1 tablet (50 mg total) by mouth every 8 (eight) hours as needed.    Dispense:  20 tablet    Refill:  0    Orders Placed This Encounter  Procedures   MR LUMBAR SPINE WO CONTRAST    Follow-up: Return for lumbar MRI  review.   Procedures: No procedures performed      Clinical History: Narrative & Impression CLINICAL DATA:  back pain   EXAM: LUMBAR SPINE - COMPLETE 4 VIEW   COMPARISON:  None Available.   FINDINGS: No fracture, dislocation or subluxation. Grade 1 L5 retrolisthesis. No osteolytic or osteoblastic changes. Aortic calcification noted.   Degenerative disc disease noted with disc  space narrowing and marginal osteophytes at L4-5 and L5-S1 with vacuum disc changes. Facet joint degenerative changes L4-5 and L5-S1.   IMPRESSION: Lumbosacral degenerative changes. No acute osseous abnormalities.     Electronically Signed   By: Layla Maw M.D.   On: 03/12/2023 17:19   She reports that she has been smoking cigarettes. She has a 12.3 pack-year smoking history. She has never used smokeless tobacco. No results for input(s): "HGBA1C", "LABURIC" in the last 8760 hours.  Objective:  VS:  HT:    WT:   BMI:     BP:108/75  HR:(!) 103bpm  TEMP: ( )  RESP:  Physical Exam Vitals and nursing note reviewed.  HENT:     Head: Normocephalic and atraumatic.     Right Ear: External ear normal.     Left Ear: External ear normal.     Nose: Nose normal.     Mouth/Throat:     Mouth: Mucous membranes are moist.  Eyes:     Extraocular Movements: Extraocular movements intact.  Cardiovascular:     Rate and Rhythm: Normal rate.     Pulses: Normal pulses.  Pulmonary:     Effort: Pulmonary effort is normal.  Abdominal:     General: Abdomen is flat. There is no distension.  Musculoskeletal:        General: Tenderness present.     Cervical back: Normal range of motion.     Comments: Patient is slow to rise from seated position to standing. Good lumbar range of motion. No pain noted with facet loading. 5/5 strength noted with bilateral hip flexion, knee flexion/extension, ankle dorsiflexion/plantarflexion and EHL. No clonus noted bilaterally. No pain upon palpation of greater trochanters. No pain with internal/external rotation of bilateral hips. Sensation intact bilaterally. Tenderness noted upon palpation of bilateral lumbar paraspinal regions. Negative slump test bilaterally. Ambulates with rolling walker, gait slow and unsteady.   Skin:    General: Skin is warm and dry.     Capillary Refill: Capillary refill takes less than 2 seconds.  Neurological:     General: No focal  deficit present.     Mental Status: She is alert and oriented to person, place, and time.  Psychiatric:        Mood and Affect: Mood normal.        Behavior: Behavior normal.     Ortho Exam  Imaging: No results found.  Past Medical/Family/Surgical/Social History: Medications & Allergies reviewed per EMR, new medications updated. 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   Class 3 severe obesity due to excess calories without serious comorbidity with body mass index (BMI) of 40.0 to 44.9 in adult [facility]) 08/31/2018   Insomnia 08/31/2018   Acute non-recurrent frontal sinusitis 07/01/2018   Primary osteoarthritis of both hips 05/03/2018   Primary osteoarthritis of both knees 05/03/2018   Essential hypertension 01/28/2018   Carpal tunnel syndrome, left upper limb 01/28/2018   Tobacco use 01/28/2018   Primary osteoarthritis of both hands 01/28/2018   DDD (degenerative disc disease), lumbar 01/28/2018  Past Medical History:  Diagnosis Date   Hypertension    Rheumatoid arthritis ([facility])    Family History  Problem Relation Age of Onset   Rheum arthritis Mother    Rheum arthritis Sister    Rheum arthritis Brother    Breast cancer Maternal Grandmother 11   Past Surgical History:  Procedure Laterality Date   HAND SURGERY Left    x2   TONSILLECTOMY     age 9    Social History   Occupational History   Not on file  Tobacco Use   Smoking status: Some Days    Current packs/day: 0.25    Average packs/day: 0.3 packs/day for 49.0 years (12.3 ttl pk-yrs)    Types: Cigarettes   Smokeless tobacco: Never  Vaping Use   Vaping status: Former  Substance and Sexual Activity   Alcohol use: Yes    Comment: occ   Drug use: Not Currently   Sexual activity: Not Currently

## 2023-05-18 ENCOUNTER — Ambulatory Visit
Admission: RE | Admit: 2023-05-18 | Discharge: 2023-05-18 | Disposition: A | Discharge status: Home / Self Care | Payer: Medicare Other | Source: Ambulatory Visit | Attending: Physical Medicine and Rehabilitation | Admitting: Physical Medicine and Rehabilitation

## 2023-05-18 DIAGNOSIS — M5416 Radiculopathy, lumbar region: Secondary | ICD-10-CM

## 2023-05-18 DIAGNOSIS — M47816 Spondylosis without myelopathy or radiculopathy, lumbar region: Secondary | ICD-10-CM

## 2023-05-18 DIAGNOSIS — M7918 Myalgia, other site: Secondary | ICD-10-CM

## 2023-05-18 DIAGNOSIS — G8929 Other chronic pain: Secondary | ICD-10-CM

## 2023-06-01 ENCOUNTER — Telehealth: Payer: Self-pay | Admitting: Physical Medicine and Rehabilitation

## 2023-06-01 NOTE — Telephone Encounter (Signed)
Patient called and said that she needs an appointment for MRI Review. CB#[phone]

## 2023-06-15 ENCOUNTER — Encounter: Payer: Self-pay | Admitting: Physical Medicine and Rehabilitation

## 2023-06-15 ENCOUNTER — Ambulatory Visit: Payer: Medicare Other | Admitting: Physical Medicine and Rehabilitation

## 2023-06-15 DIAGNOSIS — M48062 Spinal stenosis, lumbar region with neurogenic claudication: Secondary | ICD-10-CM

## 2023-06-15 DIAGNOSIS — M5442 Lumbago with sciatica, left side: Secondary | ICD-10-CM

## 2023-06-15 DIAGNOSIS — M5416 Radiculopathy, lumbar region: Secondary | ICD-10-CM

## 2023-06-15 DIAGNOSIS — G8929 Other chronic pain: Secondary | ICD-10-CM

## 2023-06-15 DIAGNOSIS — M47816 Spondylosis without myelopathy or radiculopathy, lumbar region: Secondary | ICD-10-CM

## 2023-06-15 DIAGNOSIS — M5441 Lumbago with sciatica, right side: Secondary | ICD-10-CM

## 2023-06-15 NOTE — Progress Notes (Signed)
 Sarah Sloan - 68 y.o. female MRN [id number]  Date of birth: 1955-06-08  Office Visit Note: Visit Date: 06/15/2023 PCP: Pcp, No Referred by: No ref. provider found  Subjective: Chief Complaint  Patient presents with   Lower Back - Pain   HPI: Sarah Sloan is a 68 y.o. female who comes in today Chronic, worsening and severe bilateral lower back pain radiating to buttocks, hips and down legs. Also reports numbness/tingling to bilateral feet. Her pain worsens several months ago. Her pain worsens with movement and activity, describes as burning and hot sensation, currently rates as 9 out of 10. Some relief of pain with home exercise regimen, rest and use of medications. Minimal relief of pain with Tramadol . Recent lumbar MRI imaging exhibits bilateral facet arthritis and 3 mm anterolisthesis of L4 on L5 that could worsen with flexion and extension. There is also multifactorial moderate spinal canal stenosis at this level. States she was evaluated by spine surgeon in 2008 whom recommended surgery, she did not proceed with surgical intervention at that time. Patient is currently walking with rolling walker, denies focal weakness, no recent trauma or falls.      Review of Systems  Musculoskeletal:  Positive for back pain.  Neurological:  Negative for tingling, sensory change, focal weakness and weakness.  All other systems reviewed and are negative.  Otherwise per HPI.  Assessment & Plan: Visit Diagnoses:    ICD-10-CM   1. Chronic bilateral low back pain with bilateral sciatica  M54.42 Ambulatory referral to Physical Medicine Rehab   M54.41    G89.29     2. Lumbar radiculopathy  M54.16 Ambulatory referral to Physical Medicine Rehab    3. Facet arthropathy, lumbar  M47.816 Ambulatory referral to Physical Medicine Rehab    4. Spinal stenosis of lumbar region with neurogenic claudication  M48.062 Ambulatory referral to Physical Medicine Rehab       Plan: Findings:  Chronic,  worsening and severe bilateral lower back pain radiating to buttocks, hips and down both legs.  Patient continues to have severe pain despite good conservative therapy such as home exercise regimen, rest and use of medications.  Patient's clinical presentation and exam are complex, her symptoms do seem more radicular in nature however her pain pattern does not follow specific dermatome.  Recent lumbar MRI imaging does show anterolisthesis and multifactorial moderate spinal canal stenosis at the level of L4-L5, there is some concern that this level is unstable and could worsen with flexion extension.  We discussed treatment plan in detail today, next step is to perform diagnostic and hopefully therapeutic bilateral L4 transforaminal epidural steroid injection under fluoroscopic guidance.  I discussed injection procedure with her in detail today, she has no questions at this time.  If good relief of pain with injection we can repeat this procedure infrequently as needed.  Should her pain persist we would consider consultation with surgeon, however I am not sure she is ideal surgical candidate due to morbid obesity and tobacco abuse.  No red flag symptoms noted upon exam today.    Meds & Orders: No orders of the defined types were placed in this encounter.   Orders Placed This Encounter  Procedures   Ambulatory referral to Physical Medicine Rehab    Follow-up: Return for Bilateral L4 transforaminal epidural steroid injection.   Procedures: No procedures performed      Clinical History: CLINICAL DATA:  Low back pain, symptoms rhesus with greater than 6 weeks of treatment. Bilateral leg pain.  EXAM: MRI LUMBAR SPINE WITHOUT CONTRAST   TECHNIQUE: Multiplanar, multisequence MR imaging of the lumbar spine was performed. No intravenous contrast was administered.   COMPARISON:  Radiography 02/22/2023.  MRI 02/04/2013.   FINDINGS: Segmentation:  5 lumbar type vertebral bodies.   Alignment:  Degenerative anterolisthesis at L4-5 of 3 mm, worsened since 2014.   Vertebrae:  Mild discogenic endplate changes at L4-5.   Conus medullaris and cauda equina: Conus extends to the L1-2 level. Conus and cauda equina appear normal.   Paraspinal and other soft tissues: Negative   Disc levels:   No significant finding from T11-12 through L1-2.   L2-3: Minimal disc bulge.  Minimal facet hypertrophy.  No stenosis.   L3-4: Small left foraminal to extraforaminal disc protrusion. Bilateral facet osteoarthritis. Some potential to affect the exiting left L3 nerve. This was not present in 2014.   L4-5: Bilateral facet osteoarthritis with anterolisthesis of 3 mm in this position. This could worsen with standing or flexion. Shallow protrusion of the disc, involuted somewhat since the study of 2014. Moderate multifactorial stenosis at this level with potential for neural compression on both sides. Foraminal stenosis on the right that could affect the exiting L4 nerve.   L5-S1: Chronic disc degeneration with loss of disc height. Endplate osteophytes and bulging of the disc. Mild facet osteoarthritis. No canal stenosis. Foraminal stenosis on the left that could possibly affect the L5 nerve, slightly worsened when compared to the study of 2014.   IMPRESSION: 1. L3-4: Small left foraminal to extraforaminal disc protrusion. Bilateral facet osteoarthritis. Some potential to affect the exiting left L3 nerve. This was not present in 2014. 2. L4-5: Bilateral facet osteoarthritis with anterolisthesis of 3 mm in this position. This could worsen with standing or flexion. Shallow protrusion of the disc, involuted somewhat since the study of 2014. Moderate multifactorial stenosis with potential for neural compression on both sides. Foraminal stenosis on the right that could affect the exiting L4 nerve. 3. L5-S1: Chronic disc degeneration with loss of disc height. Endplate osteophytes and bulging of  the disc. Mild facet osteoarthritis. Foraminal stenosis on the left that could possibly affect the L5 nerve, slightly worsened when compared to the study of 2014.     Electronically Signed   By: Oneil Officer M.D.   On: 06/04/2023 09:58   She reports that she has been smoking cigarettes. She has a 12.3 pack-year smoking history. She has never used smokeless tobacco. No results for input(s): HGBA1C, LABURIC in the last 8760 hours.  Objective:  VS:  HT:    WT:   BMI:     BP:   HR: bpm  TEMP: ( )  RESP:  Physical Exam Vitals and nursing note reviewed.  HENT:     Head: Normocephalic and atraumatic.     Right Ear: External ear normal.     Left Ear: External ear normal.     Nose: Nose normal.     Mouth/Throat:     Mouth: Mucous membranes are moist.  Eyes:     Extraocular Movements: Extraocular movements intact.  Cardiovascular:     Rate and Rhythm: Normal rate.     Pulses: Normal pulses.  Pulmonary:     Effort: Pulmonary effort is normal.  Abdominal:     General: Abdomen is flat. There is no distension.  Genitourinary:    General: Normal vulva.  Musculoskeletal:        General: Tenderness present.     Cervical back: Normal range of motion.  Comments: Patient is slow to rise from seated position to standing. Good lumbar range of motion. No pain noted with facet loading. 5/5 strength noted with bilateral hip flexion, knee flexion/extension, ankle dorsiflexion/plantarflexion and EHL. No clonus noted bilaterally. No pain upon palpation of greater trochanters. No pain with internal/external rotation of bilateral hips. Sensation intact bilaterally. Tenderness noted upon palpation of bilateral lumbar paraspinal regions. Negative slump test bilaterally. Ambulates with rolling walker, gait slow and unsteady.    Skin:    General: Skin is warm and dry.     Capillary Refill: Capillary refill takes less than 2 seconds.  Neurological:     Mental Status: She is alert and oriented to  person, place, and time.     Gait: Gait abnormal.     Ortho Exam  Imaging: No results found.  Past Medical/Family/Surgical/Social History: Medications & Allergies reviewed per EMR, new medications updated. 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   Class 3 severe obesity due to excess calories without serious comorbidity with body mass index (BMI) of 40.0 to 44.9 in adult [facility]) 08/31/2018   Insomnia 08/31/2018   Acute non-recurrent frontal sinusitis 07/01/2018   Primary osteoarthritis of both hips 05/03/2018   Primary osteoarthritis of both knees 05/03/2018   Essential hypertension 01/28/2018   Carpal tunnel syndrome, left upper limb 01/28/2018   Tobacco use 01/28/2018   Primary osteoarthritis of both hands 01/28/2018   DDD (degenerative disc disease), lumbar 01/28/2018   Past Medical History:  Diagnosis Date   Hypertension    Rheumatoid arthritis (HCC)    Family History  Problem Relation Age of Onset   Rheum arthritis Mother    Rheum arthritis Sister    Rheum arthritis Brother    Breast cancer Maternal Grandmother 15   Past Surgical History:  Procedure Laterality Date   HAND SURGERY Left    x2   TONSILLECTOMY     age 9    Social History   Occupational History   Not on file  Tobacco Use   Smoking status: Some Days    Current packs/day: 0.25    Average packs/day: 0.3 packs/day for 49.0 years (12.3 ttl pk-yrs)    Types: Cigarettes   Smokeless tobacco: Never  Vaping Use   Vaping status: Former  Substance and Sexual Activity   Alcohol use: Yes    Comment: occ   Drug use: Not Currently   Sexual activity: Not Currently

## 2023-07-01 ENCOUNTER — Other Ambulatory Visit: Payer: Self-pay

## 2023-07-01 ENCOUNTER — Ambulatory Visit: Payer: Medicare Other | Admitting: Physical Medicine and Rehabilitation

## 2023-07-01 DIAGNOSIS — M5416 Radiculopathy, lumbar region: Secondary | ICD-10-CM

## 2023-07-01 MED ORDER — METHYLPREDNISOLONE ACETATE 40 MG/ML IJ SUSP
40.0000 mg | Freq: Once | INTRAMUSCULAR | Status: AC
  Administered 2023-07-01: 40 mg

## 2023-07-01 NOTE — Procedures (Signed)
Lumbosacral Transforaminal Epidural Steroid Injection - Sub-Pedicular Approach with Fluoroscopic Guidance  Patient: Sarah Sloan      Date of Birth: 1956-03-03 MRN: [id number] PCP: Pcp, No      Visit Date: 07/01/2023   Universal Protocol:    Date/Time: 07/01/2023  Consent Given By: the patient  Position: PRONE  Additional Comments: Vital signs were monitored before and after the procedure. Patient was prepped and draped in the usual sterile fashion. The correct patient, procedure, and site was verified.   Injection Procedure Details:   Procedure diagnoses: Lumbar radiculopathy [M54.16]    Meds Administered:  Meds ordered this encounter  Medications   methylPREDNISolone acetate (DEPO-MEDROL) injection 40 mg    Laterality: Bilateral  Location/Site: L4  Needle:6.0 in., 22 ga.  Short bevel or Quincke spinal needle  Needle Placement: Transforaminal  Findings:    -Comments: Excellent flow of contrast along the nerve, nerve root and into the epidural space.  Procedure Details: After squaring off the end-plates to get a true AP view, the C-arm was positioned so that an oblique view of the foramen as noted above was visualized. The target area is just inferior to the "nose of the scotty dog" or sub pedicular. The soft tissues overlying this structure were infiltrated with 2-3 ml. of 1% Lidocaine without Epinephrine.  The spinal needle was inserted toward the target using a "trajectory" view along the fluoroscope beam.  Under AP and lateral visualization, the needle was advanced so it did not puncture dura and was located close the 6 O'Clock position of the pedical in AP tracterory. Biplanar projections were used to confirm position. Aspiration was confirmed to be negative for CSF and/or blood. A 1-2 ml. volume of Isovue-250 was injected and flow of contrast was noted at each level. Radiographs were obtained for documentation purposes.   After attaining the desired flow of  contrast documented above, a 0.5 to 1.0 ml test dose of 0.25% Marcaine was injected into each respective transforaminal space.  The patient was observed for 90 seconds post injection.  After no sensory deficits were reported, and normal lower extremity motor function was noted,   the above injectate was administered so that equal amounts of the injectate were placed at each foramen (level) into the transforaminal epidural space.   Additional Comments:  The patient tolerated the procedure well Dressing: 2 x 2 sterile gauze and Band-Aid    Post-procedure details: Patient was observed during the procedure. Post-procedure instructions were reviewed.  Patient left the clinic in stable condition.

## 2023-07-01 NOTE — Progress Notes (Signed)
Functional Pain Scale - descriptive words and definitions  Moderate (4)   Constantly aware of pain, can complete ADLs with modification/sleep marginally affected at times/passive distraction is of no use, but active distraction gives some relief. Moderate range order  Average Pain 9  Pain is the same on both sides.  Numbness / tingling on both sides L > R  +Driver, -BT, -Dye Allergies.

## 2023-07-01 NOTE — Patient Instructions (Signed)

## 2023-07-01 NOTE — Progress Notes (Signed)
Sarah Sloan - 68 y.o. female MRN [id number]  Date of birth: August 25, 1955  Office Visit Note: Visit Date: 07/01/2023 PCP: Pcp, No Referred by: Juanda Chance, NP  Subjective: Chief Complaint  Patient presents with   Lower Back - Pain   HPI:  Sarah Sloan is a 67 y.o. female who comes in today at the request of Ellin Goodie, FNP for planned Bilateral L4-5 Lumbar Transforaminal epidural steroid injection with fluoroscopic guidance.  The patient has failed conservative care including home exercise, medications, time and activity modification.  This injection will be diagnostic and hopefully therapeutic.  Please see requesting physician notes for further details and justification.   ROS Otherwise per HPI.  Assessment & Plan: Visit Diagnoses:    ICD-10-CM   1. Lumbar radiculopathy  M54.16 XR C-ARM NO REPORT    Epidural Steroid injection    methylPREDNISolone acetate (DEPO-MEDROL) injection 40 mg      Plan: No additional findings.   Meds & Orders:  Meds ordered this encounter  Medications   methylPREDNISolone acetate (DEPO-MEDROL) injection 40 mg    Orders Placed This Encounter  Procedures   XR C-ARM NO REPORT   Epidural Steroid injection    Follow-up: Return for visit to requesting provider as needed.   Procedures: No procedures performed  Lumbosacral Transforaminal Epidural Steroid Injection - Sub-Pedicular Approach with Fluoroscopic Guidance  Patient: Sarah Sloan      Date of Birth: 1956/05/18 MRN: [id number] PCP: Pcp, No      Visit Date: 07/01/2023   Universal Protocol:    Date/Time: 07/01/2023  Consent Given By: the patient  Position: PRONE  Additional Comments: Vital signs were monitored before and after the procedure. Patient was prepped and draped in the usual sterile fashion. The correct patient, procedure, and site was verified.   Injection Procedure Details:   Procedure diagnoses: Lumbar radiculopathy [M54.16]    Meds Administered:   Meds ordered this encounter  Medications   methylPREDNISolone acetate (DEPO-MEDROL) injection 40 mg    Laterality: Bilateral  Location/Site: L4  Needle:6.0 in., 22 ga.  Short bevel or Quincke spinal needle  Needle Placement: Transforaminal  Findings:    -Comments: Excellent flow of contrast along the nerve, nerve root and into the epidural space.  Procedure Details: After squaring off the end-plates to get a true AP view, the C-arm was positioned so that an oblique view of the foramen as noted above was visualized. The target area is just inferior to the "nose of the scotty dog" or sub pedicular. The soft tissues overlying this structure were infiltrated with 2-3 ml. of 1% Lidocaine without Epinephrine.  The spinal needle was inserted toward the target using a "trajectory" view along the fluoroscope beam.  Under AP and lateral visualization, the needle was advanced so it did not puncture dura and was located close the 6 O'Clock position of the pedical in AP tracterory. Biplanar projections were used to confirm position. Aspiration was confirmed to be negative for CSF and/or blood. A 1-2 ml. volume of Isovue-250 was injected and flow of contrast was noted at each level. Radiographs were obtained for documentation purposes.   After attaining the desired flow of contrast documented above, a 0.5 to 1.0 ml test dose of 0.25% Marcaine was injected into each respective transforaminal space.  The patient was observed for 90 seconds post injection.  After no sensory deficits were reported, and normal lower extremity motor function was noted,   the above injectate was administered so that  equal amounts of the injectate were placed at each foramen (level) into the transforaminal epidural space.   Additional Comments:  The patient tolerated the procedure well Dressing: 2 x 2 sterile gauze and Band-Aid    Post-procedure details: Patient was observed during the procedure. Post-procedure  instructions were reviewed.  Patient left the clinic in stable condition.    Clinical History: CLINICAL DATA:  Low back pain, symptoms rhesus with greater than 6 weeks of treatment. Bilateral leg pain.   EXAM: MRI LUMBAR SPINE WITHOUT CONTRAST   TECHNIQUE: Multiplanar, multisequence MR imaging of the lumbar spine was performed. No intravenous contrast was administered.   COMPARISON:  Radiography 02/22/2023.  MRI 02/04/2013.   FINDINGS: Segmentation:  5 lumbar type vertebral bodies.   Alignment: Degenerative anterolisthesis at L4-5 of 3 mm, worsened since 2014.   Vertebrae:  Mild discogenic endplate changes at L4-5.   Conus medullaris and cauda equina: Conus extends to the L1-2 level. Conus and cauda equina appear normal.   Paraspinal and other soft tissues: Negative   Disc levels:   No significant finding from T11-12 through L1-2.   L2-3: Minimal disc bulge.  Minimal facet hypertrophy.  No stenosis.   L3-4: Small left foraminal to extraforaminal disc protrusion. Bilateral facet osteoarthritis. Some potential to affect the exiting left L3 nerve. This was not present in 2014.   L4-5: Bilateral facet osteoarthritis with anterolisthesis of 3 mm in this position. This could worsen with standing or flexion. Shallow protrusion of the disc, involuted somewhat since the study of 2014. Moderate multifactorial stenosis at this level with potential for neural compression on both sides. Foraminal stenosis on the right that could affect the exiting L4 nerve.   L5-S1: Chronic disc degeneration with loss of disc height. Endplate osteophytes and bulging of the disc. Mild facet osteoarthritis. No canal stenosis. Foraminal stenosis on the left that could possibly affect the L5 nerve, slightly worsened when compared to the study of 2014.   IMPRESSION: 1. L3-4: Small left foraminal to extraforaminal disc protrusion. Bilateral facet osteoarthritis. Some potential to affect the  exiting left L3 nerve. This was not present in 2014. 2. L4-5: Bilateral facet osteoarthritis with anterolisthesis of 3 mm in this position. This could worsen with standing or flexion. Shallow protrusion of the disc, involuted somewhat since the study of 2014. Moderate multifactorial stenosis with potential for neural compression on both sides. Foraminal stenosis on the right that could affect the exiting L4 nerve. 3. L5-S1: Chronic disc degeneration with loss of disc height. Endplate osteophytes and bulging of the disc. Mild facet osteoarthritis. Foraminal stenosis on the left that could possibly affect the L5 nerve, slightly worsened when compared to the study of 2014.     Electronically Signed   By: Paulina Fusi M.D.   On: 06/04/2023 09:58     Objective:  VS:  HT:    WT:   BMI:     BP:   HR: bpm  TEMP: ( )  RESP:  Physical Exam Vitals and nursing note reviewed.  Constitutional:      General: She is not in acute distress.    Appearance: Normal appearance. She is obese. She is not ill-appearing.  HENT:     Head: Normocephalic and atraumatic.     Right Ear: External ear normal.     Left Ear: External ear normal.  Eyes:     Extraocular Movements: Extraocular movements intact.  Cardiovascular:     Rate and Rhythm: Normal rate.  Pulses: Normal pulses.  Pulmonary:     Effort: Pulmonary effort is normal. No respiratory distress.  Abdominal:     General: There is no distension.     Palpations: Abdomen is soft.  Musculoskeletal:        General: Tenderness present.     Cervical back: Neck supple.     Right lower leg: No edema.     Left lower leg: No edema.     Comments: Patient has good distal strength with no pain over the greater trochanters.  No clonus or focal weakness.  Skin:    Findings: No erythema, lesion or rash.  Neurological:     General: No focal deficit present.     Mental Status: She is alert and oriented to person, place, and time.     Sensory: No  sensory deficit.     Motor: No weakness or abnormal muscle tone.     Coordination: Coordination normal.  Psychiatric:        Mood and Affect: Mood normal.        Behavior: Behavior normal.      Imaging: No results found.

## 2023-09-22 ENCOUNTER — Other Ambulatory Visit: Payer: Self-pay | Admitting: Family Medicine

## 2023-09-22 DIAGNOSIS — F17210 Nicotine dependence, cigarettes, uncomplicated: Secondary | ICD-10-CM

## 2023-10-07 ENCOUNTER — Ambulatory Visit
Admission: RE | Admit: 2023-10-07 | Discharge: 2023-10-07 | Disposition: A | Discharge status: Home / Self Care | Source: Ambulatory Visit | Attending: Family Medicine | Admitting: Family Medicine

## 2023-10-07 DIAGNOSIS — F17210 Nicotine dependence, cigarettes, uncomplicated: Secondary | ICD-10-CM

## 2024-04-03 ENCOUNTER — Encounter: Payer: Self-pay | Admitting: Radiology
